# Patient Record
Sex: Female | Born: 1995
Health system: Southern US, Community
[De-identification: ages and names within clinical notes are randomized; demographics above are authoritative.]

## PROBLEM LIST (undated history)

## (undated) DIAGNOSIS — N200 Calculus of kidney: Secondary | ICD-10-CM

## (undated) DIAGNOSIS — N926 Irregular menstruation, unspecified: Secondary | ICD-10-CM

## (undated) DIAGNOSIS — Z8669 Personal history of other diseases of the nervous system and sense organs: Secondary | ICD-10-CM

## (undated) DIAGNOSIS — J45909 Unspecified asthma, uncomplicated: Secondary | ICD-10-CM

## (undated) DIAGNOSIS — Z8742 Personal history of other diseases of the female genital tract: Secondary | ICD-10-CM

## (undated) DIAGNOSIS — D649 Anemia, unspecified: Secondary | ICD-10-CM

## (undated) HISTORY — DX: Unspecified asthma, uncomplicated: J45.909

## (undated) HISTORY — DX: Personal history of other diseases of the nervous system and sense organs: Z86.69

## (undated) HISTORY — DX: Anemia, unspecified: D64.9

## (undated) HISTORY — DX: Personal history of other diseases of the female genital tract: Z87.42

## (undated) HISTORY — DX: Calculus of kidney: N20.0

## (undated) HISTORY — DX: Irregular menstruation, unspecified: N92.6

## (undated) HISTORY — PX: WISDOM TOOTH EXTRACTION: SHX21

---
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
## 2016-08-10 ENCOUNTER — Other Ambulatory Visit: Payer: Self-pay | Admitting: Physician Assistant

## 2016-12-13 ENCOUNTER — Other Ambulatory Visit: Payer: Self-pay | Admitting: Obstetrics & Gynecology

## 2019-06-25 ENCOUNTER — Encounter: Payer: Self-pay | Admitting: Obstetrics and Gynecology

## 2019-06-25 ENCOUNTER — Telehealth: Payer: Self-pay | Admitting: Obstetrics and Gynecology

## 2019-06-25 ENCOUNTER — Ambulatory Visit (INDEPENDENT_AMBULATORY_CARE_PROVIDER_SITE_OTHER): Payer: 59 | Admitting: Obstetrics and Gynecology

## 2019-06-25 ENCOUNTER — Other Ambulatory Visit: Payer: Self-pay

## 2019-06-25 VITALS — BP 105/73 | HR 106 | Ht 61.0 in | Wt 142.0 lb

## 2019-06-25 DIAGNOSIS — Z3009 Encounter for other general counseling and advice on contraception: Secondary | ICD-10-CM | POA: Diagnosis not present

## 2019-06-25 NOTE — Progress Notes (Signed)
HPI:      Christine Mcbride is a 24 y.o. G0P0 who LMP was Patient's last menstrual period was 06/25/2019.  Subjective:   She presents today to discuss her birth control options.  She currently has Nexplanon and says that has begun to hurt her arm and it has caused irregular bleeding for the last 2 years.  She states that she wants it out and is considering other forms of birth control. Of significant note patient has tried IUD (for 1 week) and had it removed, she has used OCPs in the past for approximately 5 years.    Hx: The following portions of the patient's history were reviewed and updated as appropriate:             She  has a past medical history of Anemia, Asthma, H/O dysmenorrhea, migraines, and Irregular menstrual cycle. She does not have a problem list on file. She  has no past surgical history on file. Her family history includes Diabetes in her father, maternal grandmother, mother, and paternal grandmother; Heart attack in her paternal grandmother; Hypertension in her maternal grandfather; Ovarian cancer in her mother. She  reports that she has never smoked. She has never used smokeless tobacco. She reports that she does not drink alcohol or use drugs. She currently has no medications in their medication list. She has No Known Allergies.       Review of Systems:  Review of Systems  Constitutional: Denied constitutional symptoms, night sweats, recent illness, fatigue, fever, insomnia and weight loss.  Eyes: Denied eye symptoms, eye pain, photophobia, vision change and visual disturbance.  Ears/Nose/Throat/Neck: Denied ear, nose, throat or neck symptoms, hearing loss, nasal discharge, sinus congestion and sore throat.  Cardiovascular: Denied cardiovascular symptoms, arrhythmia, chest pain/pressure, edema, exercise intolerance, orthopnea and palpitations.  Respiratory: Denied pulmonary symptoms, asthma, pleuritic pain, productive sputum, cough, dyspnea and wheezing.   Gastrointestinal: Denied, gastro-esophageal reflux, melena, nausea and vomiting.  Genitourinary: See HPI for additional information.  Musculoskeletal: Denied musculoskeletal symptoms, stiffness, swelling, muscle weakness and myalgia.  Dermatologic: Denied dermatology symptoms, rash and scar.  Neurologic: Denied neurology symptoms, dizziness, headache, neck pain and syncope.  Psychiatric: Denied psychiatric symptoms, anxiety and depression.  Endocrine: Denied endocrine symptoms including hot flashes and night sweats.   Meds:   No current outpatient medications on file prior to visit.   No current facility-administered medications on file prior to visit.    Objective:     Vitals:   06/25/19 1408  BP: 105/73  Pulse: (!) 106                Assessment:    G0P0 There are no problems to display for this patient.    1. Birth control counseling        Plan:            1.  Birth Control I discussed multiple birth control options and methods with the patient.  The risks and benefits of each were reviewed. We specifically discussed IUD Mirena and Kyleena, NuvaRing, multiple forms of OCPs including pills designed to lighten your menstrual period, pills designed to skip menstrual periods etc.  All questions were answered. 2.  Patient would like to schedule a visit for Nexplanon removal and OCP start.  At her 17-month follow-up after beginning OCPs she would like her annual exam performed with her first Pap smear.  Orders No orders of the defined types were placed in this encounter.   No orders of the defined  types were placed in this encounter.     F/U  Return in about 1 week (around 07/02/2019). I spent 18 minutes involved in the care of this patient preparing to see the patient by obtaining and reviewing her medical history (including labs, imaging tests and prior procedures), documenting clinical information in the electronic health record (EHR), counseling and coordinating  care plans, writing and sending prescriptions, ordering tests or procedures and directly communicating with the patient by discussing pertinent items from her history and physical exam as well as detailing my assessment and plan as noted above so that she has an informed understanding.  All of her questions were answered.  Elonda Husky, M.D. 06/25/2019 2:36 PM

## 2019-06-25 NOTE — Telephone Encounter (Signed)
Called pt to schud a follow up from a visit the number given is for [employer] and the pt doesn't work for them

## 2019-07-02 ENCOUNTER — Ambulatory Visit (INDEPENDENT_AMBULATORY_CARE_PROVIDER_SITE_OTHER): Payer: 59 | Admitting: Obstetrics and Gynecology

## 2019-07-02 ENCOUNTER — Encounter: Payer: Self-pay | Admitting: Obstetrics and Gynecology

## 2019-07-02 ENCOUNTER — Other Ambulatory Visit: Payer: Self-pay

## 2019-07-02 VITALS — BP 111/70 | HR 87 | Wt 144.0 lb

## 2019-07-02 DIAGNOSIS — Z3046 Encounter for surveillance of implantable subdermal contraceptive: Secondary | ICD-10-CM

## 2019-07-02 DIAGNOSIS — Z30011 Encounter for initial prescription of contraceptive pills: Secondary | ICD-10-CM

## 2019-07-02 MED ORDER — LEVONORGEST-ETH ESTRAD 91-DAY 0.15-0.03 &0.01 MG PO TABS: 1.0000 | ORAL_TABLET | Freq: Every day | ORAL | 1 refills | Status: DC

## 2019-07-02 NOTE — Progress Notes (Signed)
HPI:      [name] is a 24 y.o. G0P0 who LMP was Patient's last menstrual period was 06/25/2019.  Subjective:   She presents today for Nexplanon removal.  She does not like some of the side effects from the Nexplanon and in addition it feels uncomfortable in her arm.  She would like to begin OCPs.  She has chosen [employer] because she would like to have her period every 3 months.    Hx: The following portions of the patient's history were reviewed and updated as appropriate:             She  has a past medical history of Anemia, Asthma, H/O dysmenorrhea, migraines, and Irregular menstrual cycle. She does not have a problem list on file. She  has no past surgical history on file. Her family history includes Diabetes in her father, maternal grandmother, mother, and paternal grandmother; Heart attack in her paternal grandmother; Hypertension in her maternal grandfather; Ovarian cancer in her mother. She  reports that she has never smoked. She has never used smokeless tobacco. She reports that she does not drink alcohol or use drugs. She currently has no medications in their medication list. She has No Known Allergies.       Review of Systems:  Review of Systems  Constitutional: Denied constitutional symptoms, night sweats, recent illness, fatigue, fever, insomnia and weight loss.  Eyes: Denied eye symptoms, eye pain, photophobia, vision change and visual disturbance.  Ears/Nose/Throat/Neck: Denied ear, nose, throat or neck symptoms, hearing loss, nasal discharge, sinus congestion and sore throat.  Cardiovascular: Denied cardiovascular symptoms, arrhythmia, chest pain/pressure, edema, exercise intolerance, orthopnea and palpitations.  Respiratory: Denied pulmonary symptoms, asthma, pleuritic pain, productive sputum, cough, dyspnea and wheezing.  Gastrointestinal: Denied, gastro-esophageal reflux, melena, nausea and vomiting.  Genitourinary: Denied genitourinary symptoms including  symptomatic vaginal discharge, pelvic relaxation issues, and urinary complaints.  Musculoskeletal: Denied musculoskeletal symptoms, stiffness, swelling, muscle weakness and myalgia.  Dermatologic: Denied dermatology symptoms, rash and scar.  Neurologic: Denied neurology symptoms, dizziness, headache, neck pain and syncope.  Psychiatric: Denied psychiatric symptoms, anxiety and depression.  Endocrine: Denied endocrine symptoms including hot flashes and night sweats.   Meds:   No current outpatient medications on file prior to visit.   No current facility-administered medications on file prior to visit.    Objective:     Vitals:   07/02/19 0933  BP: 111/70  Pulse: 87              Implanon removal Procedure note - The implant was noted in the patient's arm and the end was identified. The skin was cleansed with a Betadine solution. A small injection of subcutaneous lidocaine with epinephrine was given over the end of the rod. An incision was made at the end of the implant. The rod was noted in the incision and grasp with a hemostat. The capsule was nicked and the rod was again grasped and removed. It was noted to be intact.  The patient's skin was again cleansed.  Steri-Strips were placed approximating the incision. Hemostasis was noted.   Assessment:    G0P0 There are no problems to display for this patient.    1. Nexplanon removal   2. Initiation of OCP (BCP)        Plan:            1.  OCPs The risks /benefits of OCPs have been explained to the patient in detail.  Product literature has been given to  her where appropriate.  I have instructed her in the use of OCPs.  I have explained to the patient that OCPs are not as effective for birth control during the first month of use, and that another form of contraception should be used during this time.  Both first-day start and Sunday start have been explained.  The risks and benefits of each was discussed.  She has been made  aware of  the fact that in rare circumstances, other medications may affect the efficacy of OCPs.  I have answered all of her questions, and I believe that she has an understanding of the effectiveness and use of OCPs.  Orders No orders of the defined types were placed in this encounter.   No orders of the defined types were placed in this encounter.     F/U  Return in about 3 months (around 09/29/2019) for Annual Physical. I spent 22 minutes involved in the care of this patient preparing to see the patient by obtaining and reviewing her medical history (including labs, imaging tests and prior procedures), documenting clinical information in the electronic health record (EHR), counseling and coordinating care plans, writing and sending prescriptions, ordering tests or procedures and directly communicating with the patient by discussing pertinent items from her history and physical exam as well as detailing my assessment and plan as noted above so that she has an informed understanding.  All of her questions were answered.  Finis Bud, M.D. 07/02/2019 9:58 AM

## 2019-09-26 ENCOUNTER — Other Ambulatory Visit (HOSPITAL_COMMUNITY)
Admission: RE | Admit: 2019-09-26 | Discharge: 2019-09-26 | Disposition: A | Discharge status: Home / Self Care | Payer: 59 | Source: Ambulatory Visit | Attending: Obstetrics and Gynecology | Admitting: Obstetrics and Gynecology

## 2019-09-26 ENCOUNTER — Ambulatory Visit (INDEPENDENT_AMBULATORY_CARE_PROVIDER_SITE_OTHER): Payer: 59 | Admitting: Obstetrics and Gynecology

## 2019-09-26 ENCOUNTER — Other Ambulatory Visit: Payer: Self-pay

## 2019-09-26 ENCOUNTER — Encounter: Payer: Self-pay | Admitting: Obstetrics and Gynecology

## 2019-09-26 VITALS — BP 115/76 | HR 97 | Temp 98.5°F | Ht 62.0 in | Wt 146.2 lb

## 2019-09-26 DIAGNOSIS — Z30011 Encounter for initial prescription of contraceptive pills: Secondary | ICD-10-CM | POA: Diagnosis not present

## 2019-09-26 DIAGNOSIS — Z01419 Encounter for gynecological examination (general) (routine) without abnormal findings: Secondary | ICD-10-CM | POA: Diagnosis present

## 2019-09-26 DIAGNOSIS — Z3046 Encounter for surveillance of implantable subdermal contraceptive: Secondary | ICD-10-CM | POA: Diagnosis not present

## 2019-09-26 MED ORDER — LEVONORGEST-ETH ESTRAD 91-DAY 0.15-0.03 &0.01 MG PO TABS
1.0000 | ORAL_TABLET | Freq: Every day | ORAL | 3 refills | Status: DC
Start: 2019-09-26 — End: 2020-09-29

## 2019-09-26 NOTE — Progress Notes (Signed)
HPI:      [name] is a 24 y.o. G0P0 who LMP was Patient's last menstrual period was 06/06/2019 (within weeks).  Subjective:   She presents today for her annual examination.  She is taking OCPs as directed.  Her bleeding has been controlled for the last 3 months and she is very happy with this method.  She is about to start her next pack.    Hx: The following portions of the patient's history were reviewed and updated as appropriate:             She  has a past medical history of Anemia, Asthma, H/O dysmenorrhea, migraines, and Irregular menstrual cycle. She does not have a problem list on file. She  has no past surgical history on file. Her family history includes Diabetes in her father, maternal grandmother, mother, and paternal grandmother; Heart attack in her paternal grandmother; Hypertension in her maternal grandfather; Ovarian cancer in her mother. She  reports that she has never smoked. She has never used smokeless tobacco. She reports that she does not drink alcohol or use drugs. She has a current medication list which includes the following prescription(s): levonorgestrel-ethinyl estradiol. She has No Known Allergies.       Review of Systems:  Review of Systems  Constitutional: Denied constitutional symptoms, night sweats, recent illness, fatigue, fever, insomnia and weight loss.  Eyes: Denied eye symptoms, eye pain, photophobia, vision change and visual disturbance.  Ears/Nose/Throat/Neck: Denied ear, nose, throat or neck symptoms, hearing loss, nasal discharge, sinus congestion and sore throat.  Cardiovascular: Denied cardiovascular symptoms, arrhythmia, chest pain/pressure, edema, exercise intolerance, orthopnea and palpitations.  Respiratory: Denied pulmonary symptoms, asthma, pleuritic pain, productive sputum, cough, dyspnea and wheezing.  Gastrointestinal: Denied, gastro-esophageal reflux, melena, nausea and vomiting.  Genitourinary: Denied genitourinary  symptoms including symptomatic vaginal discharge, pelvic relaxation issues, and urinary complaints.  Musculoskeletal: Denied musculoskeletal symptoms, stiffness, swelling, muscle weakness and myalgia.  Dermatologic: Denied dermatology symptoms, rash and scar.  Neurologic: Denied neurology symptoms, dizziness, headache, neck pain and syncope.  Psychiatric: Denied psychiatric symptoms, anxiety and depression.  Endocrine: Denied endocrine symptoms including hot flashes and night sweats.   Meds:   No current outpatient medications on file prior to visit.   No current facility-administered medications on file prior to visit.    Objective:     Vitals:   09/26/19 0806  BP: 115/76  Pulse: 97  Temp: 98.5 F (36.9 C)              Physical examination General NAD, Conversant  HEENT Atraumatic; Op clear with mmm.  Normo-cephalic. Pupils reactive. Anicteric sclerae  Thyroid/Neck Smooth without nodularity or enlargement. Normal ROM.  Neck Supple.  Skin No rashes, lesions or ulceration. Normal palpated skin turgor. No nodularity.  Small skin nodular structure in the left inguinal region.  Breasts: No masses or discharge.  Symmetric.  No axillary adenopathy.  Lungs: Clear to auscultation.No rales or wheezes. Normal Respiratory effort, no retractions.  Heart: NSR.  No murmurs or rubs appreciated. No periferal edema  Abdomen: Soft.  Non-tender.  No masses.  No HSM. No hernia  Extremities: Moves all appropriately.  Normal ROM for age. No lymphadenopathy.  Neuro: Oriented to PPT.  Normal mood. Normal affect.     Pelvic:   Vulva: Normal appearance.  No lesions.  Vagina: No lesions or abnormalities noted.  Support: Normal pelvic support.  Urethra No masses tenderness or scarring.  Meatus Normal size without lesions or prolapse.  Cervix:  Normal appearance.  No lesions.  Anus: Normal exam.  No lesions.  Perineum: Normal exam.  No lesions.        Bimanual   Uterus: Normal size.  Non-tender.   Mobile.  AV.  Adnexae: No masses.  Non-tender to palpation.  Cul-de-sac: Negative for abnormality.      Assessment:    G0P0 There are no problems to display for this patient.    1. Well woman exam with routine gynecological exam   2. Nexplanon removal   3. Initiation of OCP (BCP)     Patient doing well on OCPs.  Would like to continue for cycle control.  Small skin nodular consistent with inclusion cyst-benign.   Plan:            1.  Basic Screening Recommendations The basic screening recommendations for asymptomatic women were discussed with the patient during her visit.  The age-appropriate recommendations were discussed with her and the rational for the tests reviewed.  When I am informed by the patient that another primary care physician has previously obtained the age-appropriate tests and they are up-to-date, only outstanding tests are ordered and referrals given as necessary.  Abnormal results of tests will be discussed with her when all of her results are completed.  Routine preventative health maintenance measures emphasized: Exercise/Diet/Weight control, Tobacco Warnings, Alcohol/Substance use risks and Stress Management Pap performed-GC/CT. 2.  Continue OCPs Orders No orders of the defined types were placed in this encounter.    Meds ordered this encounter  Medications  . Levonorgestrel-Ethinyl Estradiol (AMETHIA) 0.15-0.03 &0.01 MG tablet    Sig: Take 1 tablet by mouth at bedtime.    Dispense:  84 tablet    Refill:  3        F/U  Return in about 1 year (around 09/25/2020) for Annual Physical.  Finis Bud, M.D. 09/26/2019 8:36 AM

## 2019-09-27 LAB — CYTOLOGY - PAP
Chlamydia: NEGATIVE
Comment: NEGATIVE
Comment: NORMAL
Diagnosis: NEGATIVE
Neisseria Gonorrhea: NEGATIVE

## 2019-10-16 ENCOUNTER — Telehealth: Payer: Self-pay | Admitting: Obstetrics and Gynecology

## 2019-10-16 NOTE — Telephone Encounter (Signed)
Patient called in saying she wanted to come in for a rash that she was having. Could you please advise?

## 2019-10-16 NOTE — Telephone Encounter (Signed)
Please advise on seeing patient.

## 2019-10-16 NOTE — Telephone Encounter (Signed)
Patient said she found the cause of her rash- patient states it was chiggers and she put alcohol on her rash and that cleared it up. Patient ended up declining appointment she had previously requested.

## 2019-10-16 NOTE — Telephone Encounter (Signed)
Can you please schedule patient.

## 2020-09-29 ENCOUNTER — Ambulatory Visit (INDEPENDENT_AMBULATORY_CARE_PROVIDER_SITE_OTHER): Payer: BC Managed Care – PPO | Admitting: Obstetrics and Gynecology

## 2020-09-29 ENCOUNTER — Other Ambulatory Visit: Payer: Self-pay

## 2020-09-29 ENCOUNTER — Encounter: Payer: Self-pay | Admitting: Obstetrics and Gynecology

## 2020-09-29 VITALS — BP 112/77 | HR 92 | Ht 61.0 in | Wt 140.6 lb

## 2020-09-29 DIAGNOSIS — Z01419 Encounter for gynecological examination (general) (routine) without abnormal findings: Secondary | ICD-10-CM | POA: Diagnosis not present

## 2020-09-29 MED ORDER — LEVONORGEST-ETH ESTRAD 91-DAY 0.15-0.03 &0.01 MG PO TABS: 1.0000 | ORAL_TABLET | Freq: Every day | ORAL | 3 refills | Status: DC

## 2020-09-29 NOTE — Progress Notes (Signed)
HPI:      [name] is a 25 y.o. G0P0 who LMP was Patient's last menstrual period was 09/10/2020 (approximate).  Subjective:   She presents today for her annual examination.  She states that her OCPs have recently begun to make her nauseated.  She has switched up how she takes them and reports that her eating habits are not very good right now.  She has overall decided to continue the same OCPs.  She likes the every 52-month cycle regulation, and her dysmenorrhea is significantly improved.     Hx: The following portions of the patient's history were reviewed and updated as appropriate:             She  has a past medical history of Anemia, Asthma, H/O dysmenorrhea, migraines, and Irregular menstrual cycle. She does not have a problem list on file. She  has no past surgical history on file. Her family history includes Diabetes in her father, maternal grandmother, mother, and paternal grandmother; Heart attack in her paternal grandmother; Hypertension in her maternal grandfather; Ovarian cancer in her mother. She  reports that she has never smoked. She has never used smokeless tobacco. She reports that she does not drink alcohol and does not use drugs. She has a current medication list which includes the following prescription(s): levonorgestrel-ethinyl estradiol. She has No Known Allergies.       Review of Systems:  Review of Systems  Constitutional: Denied constitutional symptoms, night sweats, recent illness, fatigue, fever, insomnia and weight loss.  Eyes: Denied eye symptoms, eye pain, photophobia, vision change and visual disturbance.  Ears/Nose/Throat/Neck: Denied ear, nose, throat or neck symptoms, hearing loss, nasal discharge, sinus congestion and sore throat.  Cardiovascular: Denied cardiovascular symptoms, arrhythmia, chest pain/pressure, edema, exercise intolerance, orthopnea and palpitations.  Respiratory: Denied pulmonary symptoms, asthma, pleuritic pain, productive  sputum, cough, dyspnea and wheezing.  Gastrointestinal: Denied, gastro-esophageal reflux, melena, nausea and vomiting.  Genitourinary: Denied genitourinary symptoms including symptomatic vaginal discharge, pelvic relaxation issues, and urinary complaints.  Musculoskeletal: Denied musculoskeletal symptoms, stiffness, swelling, muscle weakness and myalgia.  Dermatologic: Denied dermatology symptoms, rash and scar.  Neurologic: Denied neurology symptoms, dizziness, headache, neck pain and syncope.  Psychiatric: Denied psychiatric symptoms, anxiety and depression.  Endocrine: Denied endocrine symptoms including hot flashes and night sweats.   Meds:   No current outpatient medications on file prior to visit.   No current facility-administered medications on file prior to visit.       [facility] - 09/29/20 0813      Pregnancy Intention Screening   Does the patient want to become pregnant in the next year? No    Does the patient's partner want to become pregnant in the next year? No    Would the patient like to discuss contraceptive options today? Yes      Contraception Wrap Up   Current Method Oral Contraceptive    End Method Oral Contraceptive    Contraception Counseling Provided Yes          The pregnancy intention screening data noted above was reviewed. Potential methods of contraception were discussed. The patient elected to proceed with Oral Contraceptive.     Objective:     Vitals:   09/29/20 0809  BP: 112/77  Pulse: 92    Filed Weights   09/29/20 0809  Weight: 140 lb 9.6 oz (63.8 kg)              Physical examination General NAD, Conversant  HEENT Atraumatic; Op  clear with mmm.  Normo-cephalic. Pupils reactive. Anicteric sclerae  Thyroid/Neck Smooth without nodularity or enlargement. Normal ROM.  Neck Supple.  Skin No rashes, lesions or ulceration. Normal palpated skin turgor. No nodularity.  Breasts: No masses or discharge.  Symmetric.  No axillary adenopathy.   Lungs: Clear to auscultation.No rales or wheezes. Normal Respiratory effort, no retractions.  Heart: NSR.  No murmurs or rubs appreciated. No periferal edema  Abdomen: Soft.  Non-tender.  No masses.  No HSM. No hernia  Extremities: Moves all appropriately.  Normal ROM for age. No lymphadenopathy.  Neuro: Oriented to PPT.  Normal mood. Normal affect.     Pelvic:   Vulva: Normal appearance.  No lesions.  Vagina: No lesions or abnormalities noted.  Support: Normal pelvic support.  Urethra No masses tenderness or scarring.  Meatus Normal size without lesions or prolapse.  Cervix: Normal appearance.  No lesions.  Anus: Normal exam.  No lesions.  Perineum: Normal exam.  No lesions.        Bimanual   Uterus: Normal size.  Non-tender.  Mobile.  AV.  Adnexae: No masses.  Non-tender to palpation.  Cul-de-sac: Negative for abnormality.      Assessment:    G0P0 There are no problems to display for this patient.    1. Well woman exam with routine gynecological exam        Plan:            1.  Basic Screening Recommendations The basic screening recommendations for asymptomatic women were discussed with the patient during her visit.  The age-appropriate recommendations were discussed with her and the rational for the tests reviewed.  When I am informed by the patient that another primary care physician has previously obtained the age-appropriate tests and they are up-to-date, only outstanding tests are ordered and referrals given as necessary.  Abnormal results of tests will be discussed with her when all of her results are completed.  Routine preventative health maintenance measures emphasized: Exercise/Diet/Weight control, Tobacco Warnings, Alcohol/Substance use risks and Stress Management 2.  Patient would like to continue OCPs. Orders No orders of the defined types were placed in this encounter.    Meds ordered this encounter  Medications  . Levonorgestrel-Ethinyl Estradiol  (AMETHIA) 0.15-0.03 &0.01 MG tablet    Sig: Take 1 tablet by mouth at bedtime.    Dispense:  84 tablet    Refill:  3            F/U  Return in about 1 year (around 09/29/2021) for Annual Physical.   Elonda Husky, M.D. 09/29/2020 8:52 AM

## 2020-10-20 ENCOUNTER — Other Ambulatory Visit: Payer: Self-pay

## 2020-10-20 ENCOUNTER — Encounter: Payer: Self-pay | Admitting: Obstetrics and Gynecology

## 2020-10-20 ENCOUNTER — Ambulatory Visit (INDEPENDENT_AMBULATORY_CARE_PROVIDER_SITE_OTHER): Payer: BC Managed Care – PPO | Admitting: Obstetrics and Gynecology

## 2020-10-20 VITALS — BP 114/77 | Temp 96.0°F | Ht 62.0 in | Wt 143.0 lb

## 2020-10-20 DIAGNOSIS — Z3201 Encounter for pregnancy test, result positive: Secondary | ICD-10-CM

## 2020-10-20 DIAGNOSIS — Z3200 Encounter for pregnancy test, result unknown: Secondary | ICD-10-CM

## 2020-10-20 DIAGNOSIS — R102 Pelvic and perineal pain: Secondary | ICD-10-CM | POA: Diagnosis not present

## 2020-10-20 NOTE — Patient Instructions (Signed)
Endometriosis  Endometriosis is a condition in which a tissue similar to the endometrium grows in places outside the uterus. The endometrium is a tissue that forms the lining of the uterus. This tissue can grow in the organs that create the eggs (ovaries), the tubes that carry the eggs to the uterus (fallopian tubes), the vagina, and the bowel. This tissue most often grows on the ovaries and inner lining of the pelvic cavity (peritoneum). When the uterus sheds the endometrium every menstrual cycle, there is bleeding wherever these types of tissue are located. This can cause pain because blood is irritating to tissues that are not normally exposed to it. Endometriosis canalso make it harder for a woman to get pregnant. What are the causes? The cause of this condition is not known. What increases the risk? The following factors may make you more likely to develop this condition: Having a family history of endometriosis. Having never given birth. Starting your period at 10 years of age or younger. What are the signs or symptoms? Often, there are no symptoms of this condition. If you do have symptoms, they may: Vary depending on where the abnormal tissue is growing. Occur during your menstrual period (most often) or at the middle of your cycle. Come and go. You may have no symptoms during some months. Stop when you no longer have your monthly periods (menopause). Symptoms may include: Pain in the area between your hip bones (pelvis). Heavier bleeding during periods. Menstrual periods that happen more than once a month. Pain during sex. Pain in the back or abdomen. Painful bowel movements. Not being able to get pregnant. How is this diagnosed? This condition is diagnosed based on your symptoms and a physical exam. You may have tests, such as: Blood tests and urine tests to help rule out other causes. Ultrasound to look for tissues that are not normal. This is often done over your skin. It is  sometimes done through the vagina (transvaginal). X-ray of the lower bowel (barium enema). CT scan. MRI. To confirm the diagnosis, your health care provider may use a device with a small camera to check tissue inside your abdomen (laparoscopy). Abnormal tissue may be removed and checked in a lab (biopsy). How is this treated? There is no cure for this condition. Treatment focuses on controlling your symptoms. The type of treatment also depends on whether you want to become pregnant in the future. This condition may be treated with: Medicines. These may include: Medicines to relieve pain, including NSAIDs such as ibuprofen. Hormone therapy. This uses artificial hormones to slow the growth of the abnormal tissue. This may include hormonal birth control, such as pills. Surgery to remove the abnormal tissue. During surgery: Tissue may be removed using a laparoscope and a laser (laparoscopic laser treatment). The fallopian tubes, uterus, and ovaries may be removed (hysterectomy). This is done in very severe cases. Follow these instructions at home: Get regular exercise. Limit alcohol use. Eat a balanced diet. Avoid caffeine. Take over-the-counter and prescription medicines only as told by your health care provider. Keep all follow-up visits as told by your health care provider. This is important. Where to find more information [employer] Ob[employer]ians [employer]nec[employer]: https://www.acog.org/ Office on Women's Health: https://www.womenshealth.gov/ Contact a health care provider if: You are having new pain or trouble controlling pain. You have problems getting pregnant. You have a fever. Get help right away if you have: Severe pain that does not get better with medicine. Severe nausea and vomiting, or   if you cannot eat or drink without vomiting. Pain that affects your abdomen only on the lower, right side. Pain in your abdomen that gets worse. Swelling in your abdomen. Blood in  your stool (feces). Summary Endometriosis is a condition in which a tissue similar to the endometrium grows in places outside the uterus. The endometrium is a tissue that forms the lining of the uterus. The cause of this condition is not known. This condition may be treated with medicines to relieve pain, hormone therapy, or surgery. If you have this condition, get regular exercise, limit alcohol use, and avoid caffeine. Get help right away if you have severe pain that does not get better with medicine, or if you have severe nausea and vomiting or blood in your stool. This information is not intended to replace advice given to you by your health care provider. Make sure you discuss any questions you have with your healthcare provider. Document Revised: 06/26/2019 Document Reviewed: 06/26/2019 Elsevier Patient Education  2021 [employer]  

## 2020-10-20 NOTE — Progress Notes (Signed)
HPI:      [name] is a 25 y.o. G0P0 who LMP was No LMP recorded.  Subjective:   She presents today stating that 2 weeks ago she had significant pelvic pain on her left side.  At the time it occurred it was disabling but she slowly got better.  She continues to experience intermittent left-sided pain.  Pain is worse with intercourse and with bowel movements.  She does report that she has had this pain her entire life but last week was the worst it has ever been. She she is is taking OCPs as directed.  She is requesting a pregnancy test today although she states that she has done some home tests and they have all been negative. She does state that she had a remote history of a pelvic STD.     Hx: The following portions of the patient's history were reviewed and updated as appropriate:             She  has a past medical history of Anemia, Asthma, H/O dysmenorrhea, migraines, and Irregular menstrual cycle. She does not have a problem list on file. She  has no past surgical history on file. Her family history includes Diabetes in her father, maternal grandmother, mother, and paternal grandmother; Heart attack in her paternal grandmother; Hypertension in her maternal grandfather; Ovarian cancer in her mother. She  reports that she has never smoked. She has never used smokeless tobacco. She reports that she does not drink alcohol and does not use drugs. She has a current medication list which includes the following prescription(s): levonorgestrel-ethinyl estradiol. She has No Known Allergies.       Review of Systems:  Review of Systems  Constitutional: Denied constitutional symptoms, night sweats, recent illness, fatigue, fever, insomnia and weight loss.  Eyes: Denied eye symptoms, eye pain, photophobia, vision change and visual disturbance.  Ears/Nose/Throat/Neck: Denied ear, nose, throat or neck symptoms, hearing loss, nasal discharge, sinus congestion and sore throat.   Cardiovascular: Denied cardiovascular symptoms, arrhythmia, chest pain/pressure, edema, exercise intolerance, orthopnea and palpitations.  Respiratory: Denied pulmonary symptoms, asthma, pleuritic pain, productive sputum, cough, dyspnea and wheezing.  Gastrointestinal: Denied, gastro-esophageal reflux, melena, nausea and vomiting.  Genitourinary: See HPI for additional information.  Musculoskeletal: Denied musculoskeletal symptoms, stiffness, swelling, muscle weakness and myalgia.  Dermatologic: Denied dermatology symptoms, rash and scar.  Neurologic: Denied neurology symptoms, dizziness, headache, neck pain and syncope.  Psychiatric: Denied psychiatric symptoms, anxiety and depression.  Endocrine: Denied endocrine symptoms including hot flashes and night sweats.   Meds:   Current Outpatient Medications on File Prior to Visit  Medication Sig Dispense Refill  . Levonorgestrel-Ethinyl Estradiol (AMETHIA) 0.15-0.03 &0.01 MG tablet Take 1 tablet by mouth at bedtime. 84 tablet 3   No current facility-administered medications on file prior to visit.          Objective:     Vitals:   10/20/20 1048  BP: 114/77  Temp: (!) 96 F (35.6 C)   Filed Weights   10/20/20 1048  Weight: 143 lb (64.9 kg)              Physical examination   Pelvic:   Vulva: Normal appearance.  No lesions.  Vagina: No lesions or abnormalities noted.  Support: Normal pelvic support.  Urethra No masses tenderness or scarring.  Meatus Normal size without lesions or prolapse.  Cervix: Normal appearance.  No lesions.  Anus: Normal exam.  No lesions.  Perineum: Normal exam.  No lesions.  Bimanual   Uterus: Normal size.  Non-tender.  Mobile.  AV.  Adnexae: No masses.  Non-tender to palpation.  Cul-de-sac: Negative for abnormality.   Urinary pregnancy test positive  Assessment:    G0P0 There are no problems to display for this patient.    1. Pelvic pain in female   2. Possible pregnancy, not  confirmed     Positive pregnancy test.   Plan:          Prenatal Plan 1.  The patient was given prenatal literature. 2.  She was continued on prenatal vitamins. 3.  A prenatal lab panel to be drawn at nurse visit. 4.  An ultrasound was ordered to better determine an EDC. 5.  A nurse visit was scheduled. 6.  Genetic testing and testing for other inheritable conditions discussed in detail. She will decide in the future whether to have these labs performed. 7.  A general overview of pregnancy testing, visit schedule, ultrasound schedule, and prenatal care was discussed. 8.  COVID and its risks associated with pregnancy, prevention by limiting exposure and use of masks, as well as the risks and benefits of vaccination during pregnancy were discussed in detail.  [facility] policy regarding office and hospital visitation and testing was explained. 9.  Benefits of breast-feeding discussed in detail including both maternal and infant benefits. Ready Set Baby website discussed.    Orders Orders Placed This Encounter  Procedures  . US OB Comp Less 14 Wks    No orders of the defined types were placed in this encounter.     F/U  Return in about 5 weeks (around 11/24/2020) for We will contact her with any abnormal test results. I spent 35 minutes involved in the care of this patient preparing to see the patient by obtaining and reviewing her medical history (including labs, imaging tests and prior procedures), documenting clinical information in the electronic health record (EHR), counseling and coordinating care plans, writing and sending prescriptions, ordering tests or procedures and directly communicating with the patient by discussing pertinent items from her history and physical exam as well as detailing my assessment and plan as noted above so that she has an informed understanding.  All of her questions were answered.  Elonda Husky, M.D. 10/20/2020 12:10 PM

## 2020-11-02 ENCOUNTER — Other Ambulatory Visit: Payer: Self-pay

## 2020-11-02 ENCOUNTER — Ambulatory Visit (INDEPENDENT_AMBULATORY_CARE_PROVIDER_SITE_OTHER): Payer: BC Managed Care – PPO

## 2020-11-02 ENCOUNTER — Other Ambulatory Visit: Payer: Self-pay | Admitting: Obstetrics and Gynecology

## 2020-11-02 VITALS — BP 110/75 | HR 92 | Ht 62.0 in | Wt 145.1 lb

## 2020-11-02 DIAGNOSIS — Z113 Encounter for screening for infections with a predominantly sexual mode of transmission: Secondary | ICD-10-CM | POA: Diagnosis not present

## 2020-11-02 DIAGNOSIS — Z0283 Encounter for blood-alcohol and blood-drug test: Secondary | ICD-10-CM | POA: Diagnosis not present

## 2020-11-02 DIAGNOSIS — Z3401 Encounter for supervision of normal first pregnancy, first trimester: Secondary | ICD-10-CM

## 2020-11-02 NOTE — Patient Instructions (Signed)
WHAT OB PATIENTS CAN EXPECT  Confirmation of pregnancy and ultrasound ordered if medically indicated-[redacted] weeks gestation New OB (NOB) intake with nurse and New OB (NOB) labs- [redacted] weeks gestation New OB (NOB) physical examination with provider- 11/[redacted] weeks gestation Flu vaccine-[redacted] weeks gestation Anatomy scan-[redacted] weeks gestation Glucose tolerance test, blood work to test for anemia, T-dap vaccine-[redacted] weeks gestation Vaginal swabs/cultures-STD/Group B strep-[redacted] weeks gestation Appointments every 4 weeks until 28 weeks Every 2 weeks from 28 weeks until 36 weeks Weekly visits from 36 weeks until delivery  Tests and Screening During Pregnancy Having certain tests and screenings during pregnancy is an important part of your prenatal care. These tests help your health care provider find problems that might affect your pregnancy. Some tests must be done for all pregnant women, and some are optional. Most of the tests and screenings do not pose any risks for you or your baby. You may need additional testing if any routinetests indicate a problem. Tests and screenings done early in pregnancy Some tests and screenings you can expect to have in early pregnancy include: Blood tests, such as: Complete blood count (CBC). This test is done to check your red and white blood cells. It can help identify a risk for anemia, infection, or bleeding. Blood typing. This test shows your blood type. It also shows whether you have a certain protein in your red blood cells called the Rh factor. It can be dangerous for your baby if you do not have this protein (Rh negative) and your baby has it (Rh positive). Tests to check for diseases that can cause birth defects or can be passed to your baby, such as: [place] measles (rubella) and chicken pox. The test indicates whether you are immune to these diseases. Hepatitis B and C. Human Immunodeficiency Virus (HIV). Syphilis. Zika virus, if you or your partner has traveled to an area  where the virus occurs. Urine testing. This checks for sugar in your urine and for signs of infection. Blood pressure. This is to check for high blood pressure and preeclampsia. Testing for sexually transmitted infections (STIs), such as chlamydia or gonorrhea. Testing for tuberculosis. You may have this skin test if you are at risk for tuberculosis. Fetal ultrasound. This is an imaging study of your growing baby. It uses sound waves to create pictures of your baby. This test may be done to help determine your due date and to ensure you do not have an ectopic pregnancy. An ectopic pregnancy is a pregnancy that grows outside of the uterus. Tests and screenings done later in pregnancy Certain tests are done for the first time later in the pregnancy. Some of the tests that were done in early pregnancy are repeated at this time. Some common tests you can expect to have later in pregnancy include: Rh antibody testing. If you are Rh negative, you will have a blood test at about 28 weeks of pregnancy to see if you are producing Rh antibodies. If you have not started to make antibodies, you will be given an injection to prevent you from making antibodies for the rest of your pregnancy. Glucose screening. This checks your blood sugar. It will show whether you are developing the type of diabetes that occurs during pregnancy (gestational diabetes). You may have this screening earlier if you have risk factors for diabetes. Screening for group B streptococcus (GBS). GBS is a type of bacteria that may live in your rectum or vagina. GBS can spread to your baby during birth. This  is done at 35-37 weeks of pregnancy. If testing is positive for GBS, you may be treated with antibiotic medicine. Urine and blood tests to monitor for other pregnancy problems, such as preeclampsia or anemia. Blood pressure to monitor for high blood pressure and preeclampsia. Fetal ultrasound. This may be repeated at 16-20 weeks to check how  your baby is growing and developing. Non-stress test. This test is done later in pregnancy to check your baby's heart rate. This may be repeated weekly if your pregnancy is high risk. Biophysical profile. This test includes ultrasound imaging and a non-stress test to ensure your baby is healthy. This test may help decide when your baby should be born. Screening for birth defects Some birth defects are caused by abnormal genes passed down through families. Early in your pregnancy, tests can be done to find out if your baby is at risk for a genetic disorder. This testing is optional. The type of testing recommended for you will depend on your family and medical history, your ethnicity, and your age. Testing may include: Screening tests. These tests may include an ultrasound, blood tests, or a combination of both. The blood tests are used to check for abnormal genes and the ultrasound is done to look for early birth defects. Carrier screening. This test involves checking the blood or saliva of both parents to see if they carry abnormal genes that could be passed down to a baby. If genetic screening shows that your baby is at risk for a genetic defect, additional diagnostic testing may be recommended, such as: Amniocentesis. This involves testing a sample of fluid from your womb (amniotic fluid). Chorionic villus sampling. In this test, a sample of cells from your placenta is checked for abnormal cells. Unlike other tests done during pregnancy, diagnostic testing does have some risk for your pregnancy. Talk to your health care provider about the risks andbenefits of genetic testing. Questions to ask your health care provider What routine tests are recommended for me? When and how will these tests be done? When will I get the results of routine tests? What do the results of these tests mean for me or my baby? Do you recommend any genetic screening tests? Which ones? Should I see a genetic counselor  before having genetic screening? Where to find more information American Pregnancy Association: americanpregnancy.org/prenatal-testing [employer]st[employer]ians [employer] Gynecologists: JewelryExec.com.pt Office on [employer] Health: KeywordPortfolios.com.br March of Dimes: marchofdimes.[email or web address]/pregnancy Summary Having certain tests and screenings during pregnancy is an important part of your prenatal care. Talk to your health care provider about what tests are right for you and your baby. In early pregnancy, testing may be done to check your risks for various conditions that can affect you and your baby. Later in pregnancy, tests may be done to ensure that your baby is growing normally and that you and your baby are staying healthy during the pregnancy. Genetic testing is optional. Talk to your health care provider about the risks and benefits of genetic testing. This information is not intended to replace advice given to you by your health care provider. Make sure you discuss any questions you have with your healthcare provider. Document Revised: 01/28/2020 Document Reviewed: 01/28/2020 Elsevier Patient Education  [place]. Morning Sickness  Morning sickness is when you feel like you may vomit (feel nauseous) during pregnancy. Sometimes, you may vomit. Morning sickness most often happens in the morning, but it can also happen at any time of the day. Some women may have morning  sickness that makes them vomit all the time. This is amore serious problem that needs treatment. What are the causes? The cause of this condition is not known. What increases the risk? You had vomiting or a feeling like you may vomit before your pregnancy. You had morning sickness in another pregnancy. You are pregnant with more than one baby, such as twins. What are the signs or symptoms? Feeling like you may vomit. Vomiting. How is this treated? Treatment is usually not needed for this condition. You may only  need to change what you eat. In some cases, your doctor may give you some things to take for your condition. These include: Vitamin B6 supplements. Medicines to treat the feeling that you may vomit. Ginger. Follow these instructions at home: Medicines Take over-the-counter and prescription medicines only as told by your doctor. Do not take any medicines until you talk with your doctor about them first. Take multivitamins before you get pregnant. These can stop or lessen the symptoms of morning sickness. Eating and drinking Eat dry toast or crackers before getting out of bed. Eat 5 or 6 small meals a day. Eat dry and bland foods like rice and baked potatoes. Do not eat greasy, fatty, or spicy foods. Have someone cook for you if the smell of food causes you to vomit or to feel like you may vomit. If you feel like you may vomit after taking prenatal vitamins, take them at night or with a snack. Eat protein foods when you need a snack. Nuts, yogurt, and cheese are good choices. Drink fluids throughout the day. Try ginger ale made with real ginger, ginger tea made from fresh grated ginger, or ginger candies. General instructions Do not smoke or use any products that contain nicotine or tobacco. If you need help quitting, ask your doctor. Use an air purifier to keep the air in your house free of smells. Get lots of fresh air. Try to avoid smells that make you feel sick. Try wearing an acupressure wristband. This is a wristband that is used to treat seasickness. Try a treatment called acupuncture. In this treatment, a doctor puts needles into certain areas of your body to make you feel better. Contact a doctor if: You need medicine to feel better. You feel dizzy or light-headed. You are losing weight. Get help right away if: The feeling that you may vomit will not go away, or you cannot stop vomiting. You faint. You have very bad pain in your belly. Summary Morning sickness is when you  feel like you may vomit (feel nauseous) during pregnancy. You may feel sick in the morning, but you can feel this way at any time of the day. Making some changes to what you eat may help your symptoms go away. This information is not intended to replace advice given to you by your health care provider. Make sure you discuss any questions you have with your healthcare provider. Document Revised: 12/23/2019 Document Reviewed: 12/02/2019 Elsevier Patient Education  2022 [employer]. How a Baby Grows During Pregnancy Pregnancy begins when a female's sperm enters a female's egg. This is called fertilization. Fertilization usually happens in one of the fallopian tubes that connect the ovaries to the uterus. The fertilized egg moves down the fallopian tube to the uterus. Once it reaches the uterus, it implants into the lining ofthe uterus and begins to grow. For the first 8 weeks, the fertilized egg is called an embryo. After 8 weeks, it is called a fetus. As  the fetus continues to grow, it receives oxygen and nutrients through the placenta, which is an organ that grows to support the developing baby. The placenta is the life support system for the baby. Itprovides oxygen and nutrition and removes waste. How long does a typical pregnancy last? A pregnancy usually lasts 280 days, or about 40 weeks. Pregnancy is divided into three periods of growth, also called trimesters: First trimester: 0-12 weeks. Second trimester: 13-27 weeks. Third trimester: 28-40 weeks. The day when your baby is ready to be born (full term) is your estimated date of delivery. However, most babies are not born ontheir estimated date of delivery. How does my baby develop month by month?  First month The fertilized egg attaches to the inside of the uterus. Some cells will form the placenta. Others will form the fetus. The arms, legs, brain, spinal cord, lungs, and heart begin to develop. At the end of the first month, the heart  begins to beat. Second month The bones, inner ear, eyelids, hands, and feet form. The genitals develop. By the end of 8 weeks, all major organs are developing. Third month All of the internal organs are forming. Teeth develop below the gums. Bones and muscles begin to grow. The spine can flex. The skin is transparent. Fingernails and toenails begin to form. Arms and legs continue to grow longer, and hands and feet develop. The fetus is about 3 inches (7.6 cm) long. Fourth month The placenta is completely formed. The external sex organs, neck, outer ear, eyebrows, eyelids, and fingernails are formed. The fetus can hear, swallow, and move its arms and legs. The kidneys begin to produce urine. The skin is covered with a white, waxy coating (vernix) and very fine hair (lanugo). Fifth month The fetus moves around more and can be felt for the first time (quickening). The fetus starts to sleep and wake up and may begin to suck a finger. The nails grow to the end of the fingers. The organ in the digestive system that makes bile (gallbladder) functions and helps to digest nutrients. If the fetus is a female, eggs are present in the ovaries. If the fetus is a female, testicles start to move down into the scrotum. Sixth month The lungs are formed. The eyes open. The brain continues to develop. Your baby has fingerprints and toe prints. Your baby's hair grows thicker. At the end of the second trimester, the fetus is about 9 inches (22.9 cm) long. Seventh month The fetus kicks and stretches. The eyes are developed enough to sense changes in light. The hands can make a grasping motion. The fetus responds to sound. Eighth month Most organs and body systems are fully developed and functioning. Bones harden, and taste buds develop. The fetus may hiccup. Certain areas of the brain are still developing. The skull remains soft. Ninth month The fetus gains about  lb (0.23 kg) each week. The lungs  are fully developed. Patterns of sleep develop. The fetus's head typically moves into a head-down position (vertex) in the uterus to prepare for birth. The fetus weighs 6-9 lb (2.72-4.08 kg) and is 19-20 inches (48.26-50.8 cm) long. How do I know if my baby is developing well? Always talk with your health care provider about any concerns that you may have about your pregnancy and your baby. At each prenatal visit, your health care provider will do several different tests to check on your health and keep track of your baby's development. These include: Fundal height and position.  To do this, your health care provider will: Measure your growing belly from your pubic bone to the top of the uterus using a tape measure. Feel your belly to determine your baby's position. Heartbeat. An ultrasound in the first trimester can confirm pregnancy and show a heartbeat, depending on how far along you are. Your health care provider will check your baby's heart rate at every prenatal visit. You will also have a second trimester ultrasound to check your baby'sdevelopment. Follow these instructions at home: Take prenatal vitamins as told by your health care provider. These include vitamins such as folic acid, iron, calcium, and vitamin D. They are important for healthy development. Take over-the-counter and prescription medicines only as told by your health care provider. Keep all follow-up visits. This is important. Follow-up visits include prenatal care and screening tests. Summary A pregnancy usually lasts 280 days, or about 40 weeks. Pregnancy is divided into three periods of growth, also called trimesters. Your health care provider will monitor your baby's growth and development throughout your pregnancy. Follow your health care provider's recommendations about taking prenatal vitamins and medicines during your pregnancy. Talk with your health care provider if you have any concerns about your pregnancy or your  developing baby. This information is not intended to replace advice given to you by your health care provider. Make sure you discuss any questions you have with your healthcare provider. Document Revised: 10/16/2019 Document Reviewed: 08/22/2019 Elsevier Patient Education  [place]. [email or web address]">  First Trimester of Pregnancy  The first trimester of pregnancy starts on the first day of your last menstrual period until the end of week 12. This is also called months 1 through 3 ofpregnancy. Body changes during your first trimester Your body goes through many changes during pregnancy. The changes usuallyreturn to normal after your baby is born. Physical changes You may gain or lose weight. Your breasts may grow larger and hurt. The area around your nipples may get darker. Dark spots or blotches may develop on your face. You may have changes in your hair. Health changes You may feel like you might vomit (nauseous), and you may vomit. You may have heartburn. You may have headaches. You may have trouble pooping (constipation). Your gums may bleed. Other changes You may get tired easily. You may pee (urinate) more often. Your menstrual periods will stop. You may not feel hungry. You may want to eat certain kinds of food. You may have changes in your emotions from day to day. You may have more dreams. Follow these instructions at home: Medicines Take over-the-counter and prescription medicines only as told by your doctor. Some medicines are not safe during pregnancy. Take a prenatal vitamin that contains at least 600 micrograms (mcg) of folic acid. Eating and drinking Eat healthy meals that include: Fresh fruits and vegetables. Whole grains. Good sources of protein, such as meat, eggs, or tofu. Low-fat dairy products. Avoid raw meat and unpasteurized juice, milk, and cheese. If you feel like you may vomit, or you vomit: Eat 4 or 5 small  meals a day instead of 3 large meals. Try eating a few soda crackers. Drink liquids between meals instead of during meals. You may need to take these actions to prevent or treat trouble pooping: Drink enough fluids to keep your pee (urine) pale yellow. Eat foods that are high in fiber. These include beans, whole grains, and fresh fruits and vegetables. Limit foods that are high in fat and sugar. These include fried or sweet foods.  Activity Exercise only as told by your doctor. Most people can do their usual exercise routine during pregnancy. Stop exercising if you have cramps or pain in your lower belly (abdomen) or low back. Do not exercise if it is too hot or too humid, or if you are in a place of great height (high altitude). Avoid heavy lifting. If you choose to, you may have sex unless your doctor tells you not to. Relieving pain and discomfort Wear a good support bra if your breasts are sore. Rest with your legs raised (elevated) if you have leg cramps or low back pain. If you have bulging veins (varicose veins) in your legs: Wear support hose as told by your doctor. Raise your feet for 15 minutes, 3-4 times a day. Limit salt in your food. Safety Wear your seat belt at all times when you are in a car. Talk with your doctor if someone is hurting you or yelling at you. Talk with your doctor if you are feeling sad or have thoughts of hurting yourself. Lifestyle Do not use hot tubs, steam rooms, or saunas. Do not douche. Do not use tampons or scented sanitary pads. Do not use herbal medicines, illegal drugs, or medicines that are not approved by your doctor. Do not drink alcohol. Do not smoke or use any products that contain nicotine or tobacco. If you need help quitting, ask your doctor. Avoid cat litter boxes and soil that is used by cats. These carry germs that can cause harm to the baby and can cause a loss of your baby by miscarriage or stillbirth. General instructions Keep all  follow-up visits. This is important. Ask for help if you need counseling or if you need help with nutrition. Your doctor can give you advice or tell you where to go for help. Visit your dentist. At home, brush your teeth with a soft toothbrush. Floss gently. Write down your questions. Take them to your prenatal visits. Where to find more information American Pregnancy Association: americanpregnancy.org [employer]ians [employer]necologists: www.acog.org Office on Women's Health: KeywordPortfolios.com.br Contact a doctor if: You are dizzy. You have a fever. You have mild cramps or pressure in your lower belly. You have a nagging pain in your belly area. You continue to feel like you may vomit, you vomit, or you have watery poop (diarrhea) for 24 hours or longer. You have a bad-smelling fluid coming from your vagina. You have pain when you pee. You are exposed to a disease that spreads from person to person, such as chickenpox, measles, Zika virus, HIV, or hepatitis. Get help right away if: You have spotting or bleeding from your vagina. You have very bad belly cramping or pain. You have shortness of breath or chest pain. You have any kind of injury, such as from a fall or a car crash. You have new or increased pain, swelling, or redness in an arm or leg. Summary The first trimester of pregnancy starts on the first day of your last menstrual period until the end of week 12 (months 1 through 3). Eat 4 or 5 small meals a day instead of 3 large meals. Do not smoke or use any products that contain nicotine or tobacco. If you need help quitting, ask your doctor. Keep all follow-up visits. This information is not intended to replace advice given to you by your health care provider. Make sure you discuss any questions you have with your healthcare provider. Document Revised: 10/16/2019 Document Reviewed: 08/22/2019 Elsevier Patient  Education  2022 [employer]. Commonly Asked  Questions During Pregnancy  Cats: A parasite can be excreted in cat feces.  To avoid exposure you need to have another person empty the little box.  If you must empty the litter box you will need to wear gloves.  Wash your hands after handling your cat.  This parasite can also be found in raw or undercooked meat so this should also be avoided.  Colds, Sore Throats, Flu: Please check your medication sheet to see what you can take for symptoms.  If your symptoms are unrelieved by these medications please call the office.  Dental Work: Most any dental work Investment banker, corporate recommends is permitted.  X-rays should only be taken during the first trimester if absolutely necessary.  Your abdomen should be shielded with a lead apron during all x-rays.  Please notify your provider prior to receiving any x-rays.  Novocaine is fine; gas is not recommended.  If your dentist requires a note from [place] prior to dental work please call the office and we will provide one for you.  Exercise: Exercise is an important part of staying healthy during your pregnancy.  You may continue most exercises you were accustomed to prior to pregnancy.  Later in your pregnancy you will most likely notice you have difficulty with activities requiring balance like riding a bicycle.  It is important that you listen to your body and avoid activities that put you at a higher risk of falling.  Adequate rest and staying well hydrated are a must!  If you have questions about the safety of specific activities ask your provider.    Exposure to Children with illness: Try to avoid obvious exposure; report any symptoms to [place] when noted,  If you have chicken pos, red measles or mumps, you should be immune to these diseases.   Please do not take any vaccines while pregnant unless you have checked with your OB provider.  Fetal Movement: After 28 weeks we recommend you do "kick counts" twice daily.  Lie or sit down in a calm quiet environment and count your  baby movements "kicks".  You should feel your baby at least 10 times per hour.  If you have not felt 10 kicks within the first hour get up, walk around and have something sweet to eat or drink then repeat for an additional hour.  If count remains less than 10 per hour notify your provider.  Fumigating: Follow your pest control agent's advice as to how long to stay out of your home.  Ventilate the area well before re-entering.  Hemorrhoids:   Most over-the-counter preparations can be used during pregnancy.  Check your medication to see what is safe to use.  It is important to use a stool softener or fiber in your diet and to drink lots of liquids.  If hemorrhoids seem to be getting worse please call the office.   Hot Tubs:  Hot tubs Jacuzzis and saunas are not recommended while pregnant.  These increase your internal body temperature and should be avoided.  Intercourse:  Sexual intercourse is safe during pregnancy as long as you are comfortable, unless otherwise advised by your provider.  Spotting may occur after intercourse; report any bright red bleeding that is heavier than spotting.  Labor:  If you know that you are in labor, please go to the hospital.  If you are unsure, please call the office and let us help you decide what to do.  Lifting, straining, etc:  If your job requires heavy lifting or straining please check with your provider for any limitations.  Generally, you should not lift items heavier than that you can lift simply with your hands and arms (no back muscles)  Painting:  Paint fumes do not harm your pregnancy, but may make you ill and should be avoided if possible.  Latex or water based paints have less odor than oils.  Use adequate ventilation while painting.  Permanents & Hair Color:  Chemicals in hair dyes are not recommended as they cause increase hair dryness which can increase hair loss during pregnancy.  " Highlighting" and permanents are allowed.  Dye may be absorbed  differently and permanents may not hold as well during pregnancy.  Sunbathing:  Use a sunscreen, as skin burns easily during pregnancy.  Drink plenty of fluids; avoid over heating.  Tanning Beds:  Because their possible side effects are still unknown, tanning beds are not recommended.  Ultrasound Scans:  Routine ultrasounds are performed at approximately 20 weeks.  You will be able to see your baby's general anatomy an if you would like to know the gender this can usually be determined as well.  If it is questionable when you conceived you may also receive an ultrasound early in your pregnancy for dating purposes.  Otherwise ultrasound exams are not routinely performed unless there is a medical necessity.  Although you can request a scan we ask that you pay for it when conducted because insurance does not cover " patient request" scans.  Work: If your pregnancy proceeds without complications you may work until your due date, unless your physician or employer advises otherwise.  Round Ligament Pain/Pelvic Discomfort:  Sharp, shooting pains not associated with bleeding are fairly common, usually occurring in the second trimester of pregnancy.  They tend to be worse when standing up or when you remain standing for long periods of time.  These are the result of pressure of certain pelvic ligaments called "round ligaments".  Rest, Tylenol and heat seem to be the most effective relief.  As the womb and fetus grow, they rise out of the pelvis and the discomfort improves.  Please notify the office if your pain seems different than that described.  It may represent a more serious condition.  Common Medications Safe in Pregnancy  Acne:      Constipation:  Benzoyl Peroxide     Colace  Clindamycin      Dulcolax Suppository  Topica Erythromycin     Fibercon  Salicylic Acid      Metamucil         Miralax AVOID:        Senakot   Accutane    Cough:  Retin-A       Cough Drops  Tetracycline      Phenergan w/  Codeine if Rx  Minocycline      Robitussin (Plain & DM)  Antibiotics:     Crabs/Lice:  Ceclor       RID  Cephalosporins    AVOID:  E-Mycins      Kwell  Keflex  Macrobid/Macrodantin   Diarrhea:  Penicillin      Kao-Pectate  Zithromax      Imodium AD         PUSH FLUIDS AVOID:       Cipro     Fever:  Tetracycline      Tylenol (Regular or Extra  Minocycline       Strength)  Levaquin  Extra Strength-Do not          Exceed 8 tabs/24 hrs Caffeine:        '200mg'$ /day (equiv. To 1 cup of coffee or  approx. 3 12 oz sodas)         Gas: Cold/Hayfever:       Gas-X  Benadryl      Mylicon  Claritin       Phazyme  **Claritin-D        Chlor-Trimeton    Headaches:  Dimetapp      ASA-Free Excedrin  Drixoral-Non-Drowsy     Cold Compress  Mucinex (Guaifenasin)     Tylenol (Regular or Extra  Sudafed/Sudafed-12 Hour     Strength)  **Sudafed PE Pseudoephedrine   Tylenol Cold & Sinus     Vicks Vapor Rub  Zyrtec  **AVOID if Problems With Blood Pressure         Heartburn: Avoid lying down for at least 1 hour after meals  Aciphex      Maalox     Rash:  Milk of Magnesia     Benadryl    Mylanta       1% Hydrocortisone Cream  Pepcid  Pepcid Complete   Sleep Aids:  Prevacid      Ambien   Prilosec       Benadryl  Rolaids       Chamomile Tea  Tums (Limit 4/day)     Unisom         Tylenol PM         Warm milk-add vanilla or  Hemorrhoids:       Sugar for taste  Anusol/Anusol H.C.  (RX: Analapram 2.5%)  Sugar Substitutes:  Hydrocortisone OTC     Ok in moderation  Preparation H      Tucks        Vaseline lotion applied to tissue with wiping    Herpes:     Throat:  Acyclovir      Oragel  Famvir  Valtrex     Vaccines:         Flu Shot Leg Cramps:       *Gardasil  Benadryl      Hepatitis A         Hepatitis B Nasal Spray:       Pneumovax  Saline Nasal Spray     Polio Booster         Tetanus Nausea:       Tuberculosis test or PPD  Vitamin B6 25 mg  TID   AVOID:    Dramamine      *Gardasil  Emetrol       Live Poliovirus  Ginger Root 250 mg QID    MMR (measles, mumps &  High Complex Carbs @ Bedtime    rebella)  Sea Bands-Accupressure    Varicella (Chickenpox)  Unisom 1/2 tab TID     *No known complications           If received before Pain:         Known pregnancy;   Darvocet       Resume series after  Lortab        Delivery  Percocet    Yeast:   Tramadol      Femstat  Tylenol 3      Gyne-lotrimin  Ultram       Monistat  Vicodin           MISC:  All Sunscreens           Hair Coloring/highlights          Insect Repellant's          (Including DEET)         Mystic Tans

## 2020-11-02 NOTE — Progress Notes (Signed)
      Christine Mcbride presents for NOB nurse intake visit. Pregnancy confirmation done at [facility], 10/20/2020, with Linzie Collin.  G1.  P0.  LMP 09/09/2020 appx with then days.  EDD 06/16/2021.  Ga [redacted]w[redacted]d. Pregnancy education material explained and given.  0 cats/ 3 dogs in the home.  NOB labs ordered. BMI less than 30. TSH/HbgA1c not ordered. Sickle cell not ordered due to race. HIV and drug screen explained and ordered. Genetic screening discussed. Genetic testing; Pt is aware that she will be having the MaterniT21 during her NOB PE with the provider.  Pt to discuss genetic testing with provider. PNV encouraged. Pt to follow up with provider in 1 weeks for NOB physical.  FMLA, Drug/HIV Screening, [facility] Financial Policy reviewed with pt and all forms signed.  Pt stated having left ovary pain and pressure and the pain is so bad that she is unable to walk.

## 2020-11-03 LAB — URINALYSIS, ROUTINE W REFLEX MICROSCOPIC
Bilirubin, UA: NEGATIVE
Glucose, UA: NEGATIVE
Ketones, UA: NEGATIVE
Leukocytes,UA: NEGATIVE
Nitrite, UA: NEGATIVE
Protein,UA: NEGATIVE
RBC, UA: NEGATIVE
Specific Gravity, UA: 1.015 (ref 1.005–1.030)
Urobilinogen, Ur: 0.2 mg/dL (ref 0.2–1.0)
pH, UA: 7 (ref 5.0–7.5)

## 2020-11-04 LAB — ABO AND RH: Rh Factor: NEGATIVE

## 2020-11-04 LAB — CULTURE, OB URINE

## 2020-11-04 LAB — TOXOPLASMA ANTIBODIES- IGG AND  IGM
Toxoplasma Antibody- IgM: <3 AU/mL (ref 0.0–7.9)
Toxoplasma IgG Ratio: <3 IU/mL (ref 0.0–7.1)

## 2020-11-04 LAB — VIRAL HEPATITIS HBV, HCV
HCV Ab: <0.1 s/co ratio (ref 0.0–0.9)
Hep B Core Total Ab: NEGATIVE
Hep B Surface Ab, Qual: NONREACTIVE
Hepatitis B Surface Ag: NEGATIVE

## 2020-11-04 LAB — VARICELLA ZOSTER ANTIBODY, IGG: Varicella zoster IgG: <135 index — ABNORMAL LOW (ref >165)

## 2020-11-04 LAB — HIV ANTIBODY (ROUTINE TESTING W REFLEX): HIV Screen 4th Generation wRfx: NONREACTIVE

## 2020-11-04 LAB — ANTIBODY SCREEN: Antibody Screen: NEGATIVE

## 2020-11-04 LAB — HCV INTERPRETATION

## 2020-11-04 LAB — RPR: RPR Ser Ql: NONREACTIVE

## 2020-11-04 LAB — URINE CULTURE, OB REFLEX

## 2020-11-04 LAB — GC/CHLAMYDIA PROBE AMP
Chlamydia trachomatis, NAA: NEGATIVE
Neisseria Gonorrhoeae by PCR: NEGATIVE

## 2020-11-04 LAB — RUBELLA SCREEN: Rubella Antibodies, IGG: 1.17 index (ref >0.99)

## 2020-11-09 ENCOUNTER — Ambulatory Visit
Admission: RE | Admit: 2020-11-09 | Discharge: 2020-11-09 | Disposition: A | Discharge status: Home / Self Care | Payer: BC Managed Care – PPO | Source: Ambulatory Visit | Attending: Obstetrics and Gynecology | Admitting: Obstetrics and Gynecology

## 2020-11-09 ENCOUNTER — Other Ambulatory Visit: Payer: Self-pay

## 2020-11-09 DIAGNOSIS — Z3A01 Less than 8 weeks gestation of pregnancy: Secondary | ICD-10-CM | POA: Diagnosis not present

## 2020-11-09 DIAGNOSIS — R102 Pelvic and perineal pain: Secondary | ICD-10-CM | POA: Diagnosis not present

## 2020-11-09 DIAGNOSIS — O26841 Uterine size-date discrepancy, first trimester: Secondary | ICD-10-CM | POA: Diagnosis not present

## 2020-11-12 ENCOUNTER — Ambulatory Visit (INDEPENDENT_AMBULATORY_CARE_PROVIDER_SITE_OTHER): Payer: BC Managed Care – PPO | Admitting: Obstetrics and Gynecology

## 2020-11-12 ENCOUNTER — Other Ambulatory Visit: Payer: Self-pay

## 2020-11-12 VITALS — BP 103/66 | HR 71 | Wt 140.1 lb

## 2020-11-12 DIAGNOSIS — Z3401 Encounter for supervision of normal first pregnancy, first trimester: Secondary | ICD-10-CM

## 2020-11-12 DIAGNOSIS — Z3A09 9 weeks gestation of pregnancy: Secondary | ICD-10-CM

## 2020-11-12 LAB — POCT URINALYSIS DIPSTICK
Bilirubin, UA: NEGATIVE
Glucose, UA: NEGATIVE
Ketones, UA: NEGATIVE
Leukocytes, UA: NEGATIVE
Nitrite, UA: NEGATIVE
Protein, UA: NEGATIVE
Spec Grav, UA: 1.01 (ref 1.010–1.025)
Urobilinogen, UA: 0.2 E.U./dL
pH, UA: 7.5 (ref 5.0–8.0)

## 2020-11-12 MED ORDER — VITAFOL GUMMIES 3.33-0.333-34.8 MG PO CHEW: CHEWABLE_TABLET | ORAL | 11 refills | Status: DC

## 2020-11-12 NOTE — Progress Notes (Signed)
NOB: EDC changed based on ultrasound.  Patient has occasional nausea but not vomiting.  Desires genetic testing at next visit.  Physical examination General NAD, Conversant  HEENT Atraumatic; Op clear with mmm.  Normo-cephalic. Pupils reactive. Anicteric sclerae  Thyroid/Neck Smooth without nodularity or enlargement. Normal ROM.  Neck Supple.  Skin No rashes, lesions or ulceration. Normal palpated skin turgor. No nodularity.  Breasts: No masses or discharge.  Symmetric.  No axillary adenopathy.  Lungs: Clear to auscultation.No rales or wheezes. Normal Respiratory effort, no retractions.  Heart: NSR.  No murmurs or rubs appreciated. No periferal edema  Abdomen: Soft.  Non-tender.  No masses.  No HSM. No hernia  Extremities: Moves all appropriately.  Normal ROM for age. No lymphadenopathy.  Neuro: Oriented to PPT.  Normal mood. Normal affect.     Pelvic:   Vulva: Normal appearance.  No lesions.  Vagina: No lesions or abnormalities noted.  Support: Normal pelvic support.  Urethra No masses tenderness or scarring.  Meatus Normal size without lesions or prolapse.  Cervix: Normal appearance.  No lesions.  Anus: Normal exam.  No lesions.  Perineum: Normal exam.  No lesions.        Bimanual   Adnexae: No masses.  Non-tender to palpation.  Uterus: Enlarged.   Non-tender.  Mobile.  AV.  Adnexae: No masses.  Non-tender to palpation.  Cul-de-sac: Negative for abnormality.  Adnexae: No masses.  Non-tender to palpation.         Pelvimetry   Diagonal: Reached.  Spines: Average.  Sacrum: Concave.  Pubic Arch: Normal.

## 2020-11-13 ENCOUNTER — Other Ambulatory Visit: Payer: Self-pay

## 2020-11-13 ENCOUNTER — Telehealth: Payer: Self-pay

## 2020-11-13 NOTE — Telephone Encounter (Signed)
LM informing pt that vitafol gummies are not covered.   Asked pt to contact office to see is she would like a different PNV.

## 2020-11-17 ENCOUNTER — Encounter: Payer: BC Managed Care – PPO | Admitting: Obstetrics and Gynecology

## 2020-11-18 LAB — DRUG PROFILE, UR, 9 DRUGS (LABCORP)
Amphetamines, Urine: NEGATIVE ng/mL
Barbiturate Quant, Ur: NEGATIVE ng/mL
Benzodiazepine Quant, Ur: NEGATIVE ng/mL
Cannabinoid Quant, Ur: POSITIVE — AB
Cocaine (Metab.): NEGATIVE ng/mL
Methadone Screen, Urine: NEGATIVE ng/mL
Opiate Quant, Ur: NEGATIVE ng/mL
PCP Quant, Ur: NEGATIVE ng/mL
Propoxyphene: NEGATIVE ng/mL

## 2020-11-18 LAB — NICOTINE SCREEN, URINE: Cotinine Ql Scrn, Ur: POSITIVE ng/mL — AB

## 2020-12-08 ENCOUNTER — Other Ambulatory Visit: Payer: 59

## 2020-12-10 ENCOUNTER — Ambulatory Visit (INDEPENDENT_AMBULATORY_CARE_PROVIDER_SITE_OTHER): Payer: BC Managed Care – PPO | Admitting: Obstetrics and Gynecology

## 2020-12-10 ENCOUNTER — Other Ambulatory Visit: Payer: Self-pay

## 2020-12-10 ENCOUNTER — Encounter: Payer: Self-pay | Admitting: Obstetrics and Gynecology

## 2020-12-10 VITALS — BP 126/76 | HR 81 | Wt 142.1 lb

## 2020-12-10 DIAGNOSIS — Z3A11 11 weeks gestation of pregnancy: Secondary | ICD-10-CM

## 2020-12-10 DIAGNOSIS — Z3401 Encounter for supervision of normal first pregnancy, first trimester: Secondary | ICD-10-CM | POA: Diagnosis not present

## 2020-12-10 LAB — POCT URINALYSIS DIPSTICK OB
Bilirubin, UA: NEGATIVE
Blood, UA: NEGATIVE
Glucose, UA: NEGATIVE
Ketones, UA: NEGATIVE
Leukocytes, UA: NEGATIVE
Nitrite, UA: NEGATIVE
POC,PROTEIN,UA: NEGATIVE
Spec Grav, UA: 1.01 (ref 1.010–1.025)
Urobilinogen, UA: 0.2 E.U./dL
pH, UA: 6.5 (ref 5.0–8.0)

## 2020-12-10 NOTE — Progress Notes (Signed)
[name]: Patient somewhat overwhelmed about being pregnant and her ability to care for herself her dogs and her new baby.  Additionally, she would have to do this "alone" as she thinks the father of the baby will not be very involved.  She was considering abortion but left the clinic without being seen.  She has spoken with her parents and her friends regarding this decision but she states that ultimately she " has to do what is best for her."  We discussed our options at length.  She wants to hear the fetal heart rate today and seems generally excited to hear it.  She is requesting genetic testing today.  aFP next visit

## 2020-12-14 LAB — MATERNIT21  PLUS CORE+ESS+SCA, BLOOD

## 2021-01-11 NOTE — Patient Instructions (Addendum)
Second Trimester of Pregnancy  The second trimester of pregnancy is from week 13 through week 27. This is also called months 4 through 6 of pregnancy. This is often the time when you feelyour best. During the second trimester: Morning sickness is less or has stopped. You may have more energy. You may feel hungry more often. At this time, your unborn baby (fetus) is growing very fast. At the end of the sixth month, the unborn baby may be up to 12 inches long and weigh about 1 pounds. You will likely start to feelthe baby move between 16 and 20 weeks of pregnancy. Body changes during your second trimester Your body continues to go through many changes during this time. The changesvary and generally return to normal after the baby is born. Physical changes You will gain more weight. You may start to get stretch marks on your hips, belly (abdomen), and breasts. Your breasts will grow and may hurt. Dark spots or blotches may develop on your face. A dark line from your belly button to the pubic area (linea nigra) may appear. You may have changes in your hair. Health changes You may have headaches. You may have heartburn. You may have trouble pooping (constipation). You may have hemorrhoids or swollen, bulging veins (varicose veins). Your gums may bleed. You may pee (urinate) more often. You may have back pain. Follow these instructions at home: Medicines Take over-the-counter and prescription medicines only as told by your doctor. Some medicines are not safe during pregnancy. Take a prenatal vitamin that contains at least 600 micrograms (mcg) of folic acid. Eating and drinking Eat healthy meals that include: Fresh fruits and vegetables. Whole grains. Good sources of protein, such as meat, eggs, or tofu. Low-fat dairy products. Avoid raw meat and unpasteurized juice, milk, and cheese. You may need to take these actions to prevent or treat trouble pooping: Drink enough fluids to keep  your pee (urine) pale yellow. Eat foods that are high in fiber. These include beans, whole grains, and fresh fruits and vegetables. Limit foods that are high in fat and sugar. These include fried or sweet foods. Activity Exercise only as told by your doctor. Most people can do their usual exercise during pregnancy. Try to exercise for 30 minutes at least 5 days a week. Stop exercising if you have pain or cramps in your belly or lower back. Do not exercise if it is too hot or too humid, or if you are in a place of great height (high altitude). Avoid heavy lifting. If you choose to, you may have sex unless your doctor tells you not to. Relieving pain and discomfort Wear a good support bra if your breasts are sore. Take warm water baths (sitz baths) to soothe pain or discomfort caused by hemorrhoids. Use hemorrhoid cream if your doctor approves. Rest with your legs raised (elevated) if you have leg cramps or low back pain. If you develop bulging veins in your legs: Wear support hose as told by your doctor. Raise your feet for 15 minutes, 3-4 times a day. Limit salt in your food. Safety Wear your seat belt at all times when you are in a car. Talk with your doctor if someone is hurting you or yelling at you a lot. Lifestyle Do not use hot tubs, steam rooms, or saunas. Do not douche. Do not use tampons or scented sanitary pads. Avoid cat litter boxes and soil used by cats. These carry germs that can harm your baby and can cause   can cause a loss of your baby by miscarriage or stillbirth. Do not use herbal medicines, illegal drugs, or medicines that are not approved by your doctor. Do not drink alcohol. Do not smoke or use any products that contain nicotine or tobacco. If you need help quitting, ask your doctor. General instructions Keep all follow-up visits. This is important. Ask your doctor about local prenatal classes. Ask your doctor about the right foods to eat or for help finding a  counselor. Where to find more information American Pregnancy Association: americanpregnancy.org American College of Obstetricians and Gynecologists: www.acog.org Office on Women's Health: womenshealth.gov/pregnancy Contact a doctor if: You have a headache that does not go away when you take medicine. You have changes in how you see, or you see spots in front of your eyes. You have mild cramps, pressure, or pain in your lower belly. You continue to feel like you may vomit (nauseous), you vomit, or you have watery poop (diarrhea). You have bad-smelling fluid coming from your vagina. You have pain when you pee or your pee smells bad. You have very bad swelling of your face, hands, ankles, feet, or legs. You have a fever. Get help right away if: You are leaking fluid from your vagina. You have spotting or bleeding from your vagina. You have very bad belly cramping or pain. You have trouble breathing. You have chest pain. You faint. You have not felt your baby move for the time period told by your doctor. You have new or increased pain, swelling, or redness in an arm or leg. Summary The second trimester of pregnancy is from week 13 through week 27 (months 4 through 6). Eat healthy meals. Exercise as told by your doctor. Most people can do their usual exercise during pregnancy. Do not use herbal medicines, illegal drugs, or medicines that are not approved by your doctor. Do not drink alcohol. Call your doctor if you get sick or if you notice anything unusual about your pregnancy. This information is not intended to replace advice given to you by your health care provider. Make sure you discuss any questions you have with your health care provider. Document Revised: 10/16/2019 Document Reviewed: 08/22/2019 Elsevier Patient Education  2022 Elsevier Inc. Common Medications Safe in Pregnancy  Acne:      Constipation:  Benzoyl Peroxide     Colace  Clindamycin      Dulcolax  Suppository  Topica Erythromycin     Fibercon  Salicylic Acid      Metamucil         Miralax AVOID:        Senakot   Accutane    Cough:  Retin-A       Cough Drops  Tetracycline      Phenergan w/ Codeine if Rx  Minocycline      Robitussin (Plain & DM)  Antibiotics:     Crabs/Lice:  Ceclor       RID  Cephalosporins    AVOID:  E-Mycins      Kwell  Keflex  Macrobid/Macrodantin   Diarrhea:  Penicillin      Kao-Pectate  Zithromax      Imodium AD         PUSH FLUIDS AVOID:       Cipro     Fever:  Tetracycline      Tylenol (Regular or Extra  Minocycline       Strength)  Levaquin      Extra Strength-Do not            Exceed 8 tabs/24 hrs Caffeine:        <200mg/day (equiv. To 1 cup of coffee or  approx. 3 12 oz sodas)         Gas: Cold/Hayfever:       Gas-X  Benadryl      Mylicon  Claritin       Phazyme  **Claritin-D        Chlor-Trimeton    Headaches:  Dimetapp      ASA-Free Excedrin  Drixoral-Non-Drowsy     Cold Compress  Mucinex (Guaifenasin)     Tylenol (Regular or Extra  Sudafed/Sudafed-12 Hour     Strength)  **Sudafed PE Pseudoephedrine   Tylenol Cold & Sinus     Vicks Vapor Rub  Zyrtec  **AVOID if Problems With Blood Pressure         Heartburn: Avoid lying down for at least 1 hour after meals  Aciphex      Maalox     Rash:  Milk of Magnesia     Benadryl    Mylanta       1% Hydrocortisone Cream  Pepcid  Pepcid Complete   Sleep Aids:  Prevacid      Ambien   Prilosec       Benadryl  Rolaids       Chamomile Tea  Tums (Limit 4/day)     Unisom         Tylenol PM         Warm milk-add vanilla or  Hemorrhoids:       Sugar for taste  Anusol/Anusol H.C.  (RX: Analapram 2.5%)  Sugar Substitutes:  Hydrocortisone OTC     Ok in moderation  Preparation H      Tucks        Vaseline lotion applied to tissue with wiping    Herpes:     Throat:  Acyclovir      Oragel  Famvir  Valtrex     Vaccines:         Flu Shot Leg  Cramps:       *Gardasil  Benadryl      Hepatitis A         Hepatitis B Nasal Spray:       Pneumovax  Saline Nasal Spray     Polio Booster         Tetanus Nausea:       Tuberculosis test or PPD  Vitamin B6 25 mg TID   AVOID:    Dramamine      *Gardasil  Emetrol       Live Poliovirus  Ginger Root 250 mg QID    MMR (measles, mumps &  High Complex Carbs @ Bedtime    rebella)  Sea Bands-Accupressure    Varicella (Chickenpox)  Unisom 1/2 tab TID     *No known complications           If received before Pain:         Known pregnancy;   Darvocet       Resume series after  Lortab        Delivery  Percocet    Yeast:   Tramadol      Femstat  Tylenol 3      Gyne-lotrimin  Ultram       Monistat  Vicodin           MISC:         All Sunscreens             Hair Coloring/highlights          Insect Repellant's          (Including DEET)         Mystic Tans  

## 2021-01-12 ENCOUNTER — Ambulatory Visit (INDEPENDENT_AMBULATORY_CARE_PROVIDER_SITE_OTHER): Payer: BC Managed Care – PPO | Admitting: Obstetrics and Gynecology

## 2021-01-12 ENCOUNTER — Other Ambulatory Visit: Payer: Self-pay

## 2021-01-12 ENCOUNTER — Encounter: Payer: Self-pay | Admitting: Obstetrics and Gynecology

## 2021-01-12 VITALS — BP 110/72 | HR 96 | Wt 142.1 lb

## 2021-01-12 DIAGNOSIS — Z1379 Encounter for other screening for genetic and chromosomal anomalies: Secondary | ICD-10-CM | POA: Diagnosis not present

## 2021-01-12 DIAGNOSIS — Z8669 Personal history of other diseases of the nervous system and sense organs: Secondary | ICD-10-CM | POA: Insufficient documentation

## 2021-01-12 DIAGNOSIS — Z6791 Unspecified blood type, Rh negative: Secondary | ICD-10-CM

## 2021-01-12 DIAGNOSIS — Z3402 Encounter for supervision of normal first pregnancy, second trimester: Secondary | ICD-10-CM

## 2021-01-12 DIAGNOSIS — O99619 Diseases of the digestive system complicating pregnancy, unspecified trimester: Secondary | ICD-10-CM

## 2021-01-12 DIAGNOSIS — O26899 Other specified pregnancy related conditions, unspecified trimester: Secondary | ICD-10-CM | POA: Insufficient documentation

## 2021-01-12 DIAGNOSIS — K219 Gastro-esophageal reflux disease without esophagitis: Secondary | ICD-10-CM | POA: Insufficient documentation

## 2021-01-12 DIAGNOSIS — Z3A16 16 weeks gestation of pregnancy: Secondary | ICD-10-CM

## 2021-01-12 LAB — POCT URINALYSIS DIPSTICK OB
Bilirubin, UA: NEGATIVE
Blood, UA: NEGATIVE
Glucose, UA: NEGATIVE
Ketones, UA: NEGATIVE
Nitrite, UA: NEGATIVE
POC,PROTEIN,UA: NEGATIVE
Spec Grav, UA: 1.01 (ref 1.010–1.025)
Urobilinogen, UA: 0.2 E.U./dL
pH, UA: 7 (ref 5.0–8.0)

## 2021-01-12 NOTE — Progress Notes (Signed)
Reports headaches.  Notes that she wakes up the morning, and then it comes and goes. Does note h/o of seasonal allergies and migraines in the past. Also complaining of reflux. Recommended OTC remedies for management. Anatomy scan ordered. Normal MaterniT21, for AFP today.  Rh neg, discussed need for Rhogam in pregnancy. Reviewed expectations of second trimester.  RTC in 4 weeks.  The patient has Medicaid.  [employer] Medicaid Risk Screening Form completed today

## 2021-01-12 NOTE — Progress Notes (Signed)
OB-Pt present for routine prenatal care. Pt c/o pain when she has a BM.

## 2021-01-19 LAB — AFP, SERUM, OPEN SPINA BIFIDA
AFP MoM: 1.15
AFP Value: 45.4 ng/mL
Gest. Age on Collection Date: 16.6 weeks
Maternal Age At EDD: 25.2 yr
OSBR Risk 1 IN: 7603
Test Results:: NEGATIVE
Weight: 142 [lb_av]

## 2021-01-22 ENCOUNTER — Emergency Department
Admission: EM | Admit: 2021-01-22 | Discharge: 2021-01-22 | Disposition: A | Discharge status: Home / Self Care | Payer: BC Managed Care – PPO | Attending: Emergency Medicine | Admitting: Emergency Medicine

## 2021-01-22 ENCOUNTER — Other Ambulatory Visit: Payer: Self-pay

## 2021-01-22 ENCOUNTER — Emergency Department: Payer: BC Managed Care – PPO

## 2021-01-22 DIAGNOSIS — R1032 Left lower quadrant pain: Secondary | ICD-10-CM | POA: Diagnosis not present

## 2021-01-22 DIAGNOSIS — J45909 Unspecified asthma, uncomplicated: Secondary | ICD-10-CM | POA: Diagnosis not present

## 2021-01-22 DIAGNOSIS — R109 Unspecified abdominal pain: Secondary | ICD-10-CM

## 2021-01-22 DIAGNOSIS — N200 Calculus of kidney: Secondary | ICD-10-CM | POA: Insufficient documentation

## 2021-01-22 DIAGNOSIS — O26899 Other specified pregnancy related conditions, unspecified trimester: Secondary | ICD-10-CM | POA: Diagnosis not present

## 2021-01-22 DIAGNOSIS — Z3A10 10 weeks gestation of pregnancy: Secondary | ICD-10-CM | POA: Diagnosis not present

## 2021-01-22 DIAGNOSIS — N049 Nephrotic syndrome with unspecified morphologic changes: Secondary | ICD-10-CM | POA: Diagnosis not present

## 2021-01-22 DIAGNOSIS — Z3A18 18 weeks gestation of pregnancy: Secondary | ICD-10-CM | POA: Diagnosis not present

## 2021-01-22 DIAGNOSIS — O26891 Other specified pregnancy related conditions, first trimester: Secondary | ICD-10-CM | POA: Diagnosis not present

## 2021-01-22 DIAGNOSIS — R600 Localized edema: Secondary | ICD-10-CM | POA: Diagnosis not present

## 2021-01-22 DIAGNOSIS — O99611 Diseases of the digestive system complicating pregnancy, first trimester: Secondary | ICD-10-CM | POA: Diagnosis not present

## 2021-01-22 LAB — CBC
HCT: 34.8 % — ABNORMAL LOW (ref 36.0–46.0)
Hemoglobin: 12.4 g/dL (ref 12.0–15.0)
MCH: 32.1 pg (ref 26.0–34.0)
MCHC: 35.6 g/dL (ref 30.0–36.0)
MCV: 90.2 fL (ref 80.0–100.0)
Platelets: 178 10*3/uL (ref 150–400)
RBC: 3.86 MIL/uL — ABNORMAL LOW (ref 3.87–5.11)
RDW: 12.7 % (ref 11.5–15.5)
WBC: 11.3 10*3/uL — ABNORMAL HIGH (ref 4.0–10.5)
nRBC: 0 % (ref 0.0–0.2)

## 2021-01-22 LAB — BASIC METABOLIC PANEL
Anion gap: 5 (ref 5–15)
BUN: 7 mg/dL (ref 6–20)
CO2: 25 mmol/L (ref 22–32)
Calcium: 8.9 mg/dL (ref 8.9–10.3)
Chloride: 107 mmol/L (ref 98–111)
Creatinine, Ser: 0.64 mg/dL (ref 0.44–1.00)
GFR, Estimated: >60 mL/min (ref >60)
Glucose, Bld: 109 mg/dL — ABNORMAL HIGH (ref 70–99)
Potassium: 3.5 mmol/L (ref 3.5–5.1)
Sodium: 137 mmol/L (ref 135–145)

## 2021-01-22 LAB — URINALYSIS, COMPLETE (UACMP) WITH MICROSCOPIC
Bilirubin Urine: NEGATIVE
Glucose, UA: NEGATIVE mg/dL
Hgb urine dipstick: NEGATIVE
Ketones, ur: NEGATIVE mg/dL
Nitrite: NEGATIVE
Protein, ur: NEGATIVE mg/dL
Specific Gravity, Urine: 1.02 (ref 1.005–1.030)
pH: 5 (ref 5.0–8.0)

## 2021-01-22 LAB — HEPATIC FUNCTION PANEL
ALT: 11 U/L (ref 0–44)
AST: 19 U/L (ref 15–41)
Albumin: 3.3 g/dL — ABNORMAL LOW (ref 3.5–5.0)
Alkaline Phosphatase: 41 U/L (ref 38–126)
Bilirubin, Direct: <0.1 mg/dL (ref 0.0–0.2)
Total Bilirubin: 0.5 mg/dL (ref 0.3–1.2)
Total Protein: 6.9 g/dL (ref 6.5–8.1)

## 2021-01-22 LAB — PREGNANCY, URINE: Preg Test, Ur: POSITIVE — AB

## 2021-01-22 LAB — LIPASE, BLOOD: Lipase: 28 U/L (ref 11–51)

## 2021-01-22 MED ORDER — ONDANSETRON 4 MG PO TBDP: 4.0000 mg | ORAL_TABLET | Freq: Three times a day (TID) | ORAL | 0 refills | Status: DC | PRN

## 2021-01-22 MED ORDER — LACTATED RINGERS IV BOLUS
1000.0000 mL | Freq: Once | INTRAVENOUS | Status: AC
  Administered 2021-01-22: 1000 mL via INTRAVENOUS

## 2021-01-22 MED ORDER — LIDOCAINE 5 % EX PTCH
1.0000 | MEDICATED_PATCH | CUTANEOUS | Status: DC
  Administered 2021-01-22: 1 via TRANSDERMAL
  Filled 2021-01-22: qty 1

## 2021-01-22 MED ORDER — OXYCODONE-ACETAMINOPHEN 5-325 MG PO TABS: 1.0000 | ORAL_TABLET | ORAL | 0 refills | Status: DC | PRN

## 2021-01-22 MED ORDER — MORPHINE SULFATE (PF) 4 MG/ML IV SOLN
4.0000 mg | Freq: Once | INTRAVENOUS | Status: AC
  Administered 2021-01-22: 4 mg via INTRAVENOUS
  Filled 2021-01-22: qty 1

## 2021-01-22 MED ORDER — ONDANSETRON 4 MG PO TBDP: 4.0000 mg | ORAL_TABLET | Freq: Once | ORAL | Status: DC | PRN

## 2021-01-22 MED ORDER — SODIUM CHLORIDE 0.9 % IV SOLN
12.5000 mg | Freq: Once | INTRAVENOUS | Status: AC
  Administered 2021-01-22: 12.5 mg via INTRAVENOUS
  Filled 2021-01-22: qty 12.5

## 2021-01-22 MED ORDER — ONDANSETRON HCL 4 MG/2ML IJ SOLN
4.0000 mg | Freq: Once | INTRAMUSCULAR | Status: AC
  Administered 2021-01-22: 4 mg via INTRAVENOUS
  Filled 2021-01-22: qty 2

## 2021-01-22 MED ORDER — CEPHALEXIN 500 MG PO CAPS: 500.0000 mg | ORAL_CAPSULE | Freq: Two times a day (BID) | ORAL | 0 refills | Status: AC

## 2021-01-22 NOTE — ED Notes (Signed)
Lab called and informed of hepatic lab add-on.  Lab verbalized understanding.

## 2021-01-22 NOTE — ED Provider Notes (Signed)
[facility] Emergency Department Provider Note   ____________________________________________   Event Date/Time   First MD Initiated Contact with Patient 01/22/21 [phone]     (approximate)  I have reviewed the triage vital signs and the nursing notes.   HISTORY  Chief Complaint Flank Pain and Abdominal Pain    HPI Christine Mcbride is a 25 y.o. female, G1, P0 at approximately 10 weeks of pregnancy, presents to the ED complaining of abdominal pain.  Patient reports that overnight she has developed steadily worsening pain in the left lower quadrant of her abdomen as well as in her lower back.  She describes pain now sharp and severe, not exacerbated or alleviated by anything.  It has been associated with nausea and multiple episodes of vomiting, but she denies any changes in her bowel movements and has not had any vaginal bleeding or discharge.  She denies similar symptoms in the past, has not had any complications with her pregnancy thus far.  Pain seems to localize to the middle of her lower back and does not affect either of her flanks.  She denies any dysuria, hematuria, or fever.  She has never had similar pain in the past and denies history of kidney stones.        Past Medical History:  Diagnosis Date   Anemia    Asthma    H/O dysmenorrhea    Hx of migraines    Irregular menstrual cycle     Patient Active Problem List   Diagnosis Date Noted   Rh negative state in antepartum period 01/12/2021   Gastroesophageal reflux in pregnancy 01/12/2021   Hx of migraine headaches 01/12/2021    History reviewed. No pertinent surgical history.  Prior to Admission medications   Medication Sig Start Date End Date Taking? Authorizing Provider  cephALEXin (KEFLEX) 500 MG capsule Take 1 capsule (500 mg total) by mouth 2 (two) times daily for 7 days. 01/22/21 01/29/21 Yes Chesley Noon, MD  ondansetron (ZOFRAN ODT) 4 MG disintegrating tablet Take 1 tablet (4 mg  total) by mouth every 8 (eight) hours as needed for nausea or vomiting. 01/22/21  Yes Chesley Noon, MD  oxyCODONE-acetaminophen (PERCOCET) 5-325 MG tablet Take 1 tablet by mouth every 4 (four) hours as needed for severe pain. 01/22/21 01/22/22 Yes Chesley Noon, MD  Prenatal Vit-Fe Fumarate-FA (PRENATAL MULTIVITAMIN) TABS tablet Take 1 tablet by mouth daily at 12 noon.    [provider]    Allergies Patient has no known allergies.  Family History  Problem Relation Age of Onset   Ovarian cancer Mother    Diabetes Father    Diabetes Maternal Grandmother    Hypertension Maternal Grandfather    Diabetes Paternal Grandmother    Heart attack Paternal Grandmother     Social History Social History   Tobacco Use   Smoking status: Never   Smokeless tobacco: Never  Vaping Use   Vaping Use: Never used  Substance Use Topics   Alcohol use: No   Drug use: No    Review of Systems  Constitutional: No fever/chills Eyes: No visual changes. ENT: No sore throat. Cardiovascular: Denies chest pain. Respiratory: Denies shortness of breath. Gastrointestinal: Positive for abdominal pain, nausea, and vomiting.  No diarrhea.  No constipation. Genitourinary: Negative for dysuria. Musculoskeletal: Positive for back pain. Skin: Negative for rash. Neurological: Negative for headaches, focal weakness or numbness.  ____________________________________________   PHYSICAL EXAM:  VITAL SIGNS: ED Triage Vitals  Enc Vitals Group  BP 01/22/21 0832 114/79     Pulse Rate 01/22/21 0832 90     Resp --      Temp 01/22/21 0832 98.3 F (36.8 C)     Temp Source 01/22/21 0832 Oral     SpO2 01/22/21 0832 98 %     Weight 01/22/21 0835 142 lb (64.4 kg)     Height 01/22/21 0835 5\' 2"  (1.575 m)     Head Circumference --      Peak Flow --      Pain Score 01/22/21 0834 10     Pain Loc --      Pain Edu? --      Excl. in GC? --     Constitutional: Alert and oriented. Eyes: Conjunctivae are  normal. Head: Atraumatic. Nose: No congestion/rhinnorhea. Mouth/Throat: Mucous membranes are moist. Neck: Normal ROM Cardiovascular: Normal rate, regular rhythm. Grossly normal heart sounds. Respiratory: Normal respiratory effort.  No retractions. Lungs CTAB. Gastrointestinal: Gravid abdomen, soft and tender to palpation in the left lower quadrant with no rebound or guarding.  No CVA tenderness bilaterally. No distention. Genitourinary: deferred Musculoskeletal: No lower extremity tenderness nor edema. Neurologic:  Normal speech and language. No gross focal neurologic deficits are appreciated. Skin:  Skin is warm, dry and intact. No rash noted. Psychiatric: Mood and affect are normal. Speech and behavior are normal.  ____________________________________________   LABS (all labs ordered are listed, but only abnormal results are displayed)  Labs Reviewed  URINALYSIS, COMPLETE (UACMP) WITH MICROSCOPIC - Abnormal; Notable for the following components:      Result Value   Color, Urine YELLOW (*)    APPearance CLOUDY (*)    Leukocytes,Ua TRACE (*)    Bacteria, UA RARE (*)    All other components within normal limits  BASIC METABOLIC PANEL - Abnormal; Notable for the following components:   Glucose, Bld 109 (*)    All other components within normal limits  CBC - Abnormal; Notable for the following components:   WBC 11.3 (*)    RBC 3.86 (*)    HCT 34.8 (*)    All other components within normal limits  PREGNANCY, URINE - Abnormal; Notable for the following components:   Preg Test, Ur POSITIVE (*)    All other components within normal limits  HEPATIC FUNCTION PANEL - Abnormal; Notable for the following components:   Albumin 3.3 (*)    All other components within normal limits  URINE CULTURE  LIPASE, BLOOD    PROCEDURES  Procedure(s) performed (including Critical Care):  Procedures   ____________________________________________   INITIAL IMPRESSION / ASSESSMENT AND PLAN /  ED COURSE      25 year old female, G1P0 at approximately 18 weeks of pregnancy, presents to the ED complaining of gradually worsening pain in the left lower quadrant of her abdomen as well as the middle of her lower back that has now become severe this morning pain is reproducible with palpation of her left lower quadrant but she has no CVA tenderness bilaterally.  She denies any vaginal bleeding or urinary symptoms.  We will further assess with labs, UA, obstetric ultrasound and renal ultrasound to rule out urinary obstruction such as kidney stone.  She continues to have severe pain with active nausea and vomiting despite Zofran, we will treat with IV morphine and Phenergan.  ----------------------------------------- 11:14 AM on 01/22/2021 ----------------------------------------- Patient continues to have severe pain, obstetric ultrasound is unremarkable and renal ultrasound shows no evidence of hydronephrosis.  UA appears unlikely to represent infection,  but given lack of alternative explanation for her pain, we will send for culture.  We will redose morphine for ongoing pain, also place Lidoderm patch in her lower back.  Case discussed with Dr. Valentino Saxon of OB/GYN, who agrees with plan to proceed with MRI of her abdomen/pelvis.  ----------------------------------------- 1:53 PM on 01/22/2021 ----------------------------------------- MRI shows mild left-sided pelviectasis and ureteral prominence, consistent with kidney stone however no stone or other obstructive process visualized.  Findings discussed with Dr. Annabell Howells of urology, who states that kidney stones are difficult to pick up on MRI, but agree symptoms seems consistent with kidney stone.  Given borderline UTI in the setting of pregnancy and possible kidney stone, we will send urine for culture and start patient on Keflex.  Dr. Delford Field agrees that patient would be appropriate for outpatient management and he will contact urology office to arrange  follow-up appointment.  We will prescribe patient a short course of pain medication, she was additionally counseled to follow-up with OB/GYN.  She was counseled to return to the ED for new worsening symptoms, patient and mother agree with plan.      ____________________________________________   FINAL CLINICAL IMPRESSION(S) / ED DIAGNOSES  Final diagnoses:  Abdominal pain in pregnancy  Kidney stone     ED Discharge Orders          Ordered    oxyCODONE-acetaminophen (PERCOCET) 5-325 MG tablet  Every 4 hours PRN        01/22/21 1352    ondansetron (ZOFRAN ODT) 4 MG disintegrating tablet  Every 8 hours PRN        01/22/21 1352    cephALEXin (KEFLEX) 500 MG capsule  2 times daily        01/22/21 1352             Note:  This document was prepared using Dragon voice recognition software and may include unintentional dictation errors.    Chesley Noon, MD 01/22/21 [phone]

## 2021-01-22 NOTE — ED Notes (Signed)
US at bedside

## 2021-01-22 NOTE — ED Notes (Signed)
ED Provider at bedside. 

## 2021-01-23 LAB — URINE CULTURE: Culture: <10000 — AB

## 2021-01-28 ENCOUNTER — Other Ambulatory Visit: Payer: Self-pay

## 2021-01-28 ENCOUNTER — Ambulatory Visit
Admission: RE | Admit: 2021-01-28 | Discharge: 2021-01-28 | Disposition: A | Discharge status: Home / Self Care | Payer: BC Managed Care – PPO | Source: Ambulatory Visit | Attending: Obstetrics and Gynecology | Admitting: Obstetrics and Gynecology

## 2021-01-28 DIAGNOSIS — Z3402 Encounter for supervision of normal first pregnancy, second trimester: Secondary | ICD-10-CM | POA: Insufficient documentation

## 2021-01-28 DIAGNOSIS — Z3492 Encounter for supervision of normal pregnancy, unspecified, second trimester: Secondary | ICD-10-CM | POA: Diagnosis not present

## 2021-01-28 DIAGNOSIS — Z3A18 18 weeks gestation of pregnancy: Secondary | ICD-10-CM | POA: Diagnosis not present

## 2021-02-01 ENCOUNTER — Telehealth: Payer: Self-pay | Admitting: Urology

## 2021-02-01 ENCOUNTER — Other Ambulatory Visit: Payer: Self-pay | Admitting: Urology

## 2021-02-01 DIAGNOSIS — N1339 Other hydronephrosis: Secondary | ICD-10-CM

## 2021-02-01 NOTE — Telephone Encounter (Signed)
I s/w pt about scheduling her RUS.  She didn't want to have this done and wants to cancel her appt w/[facility].  She said she passed her stone while she was in hospital.

## 2021-02-03 ENCOUNTER — Ambulatory Visit: Payer: BC Managed Care – PPO | Admitting: Urology

## 2021-02-09 ENCOUNTER — Other Ambulatory Visit: Payer: Self-pay

## 2021-02-09 ENCOUNTER — Encounter: Payer: Self-pay | Admitting: Obstetrics and Gynecology

## 2021-02-09 ENCOUNTER — Ambulatory Visit (INDEPENDENT_AMBULATORY_CARE_PROVIDER_SITE_OTHER): Payer: BC Managed Care – PPO | Admitting: Obstetrics and Gynecology

## 2021-02-09 VITALS — BP 92/63 | HR 84 | Wt 144.7 lb

## 2021-02-09 DIAGNOSIS — R319 Hematuria, unspecified: Secondary | ICD-10-CM

## 2021-02-09 DIAGNOSIS — Z3402 Encounter for supervision of normal first pregnancy, second trimester: Secondary | ICD-10-CM

## 2021-02-09 DIAGNOSIS — Z3A20 20 weeks gestation of pregnancy: Secondary | ICD-10-CM

## 2021-02-09 LAB — POCT URINALYSIS DIPSTICK OB
Bilirubin, UA: NEGATIVE
Glucose, UA: NEGATIVE
Ketones, UA: NEGATIVE
Nitrite, UA: NEGATIVE
Spec Grav, UA: 1.02 (ref 1.010–1.025)
Urobilinogen, UA: 0.2 E.U./dL
pH, UA: 7 (ref 5.0–8.0)

## 2021-02-09 MED ORDER — NITROFURANTOIN MONOHYD MACRO 100 MG PO CAPS: 100.0000 mg | ORAL_CAPSULE | Freq: Two times a day (BID) | ORAL | 0 refills | Status: DC

## 2021-02-09 NOTE — Progress Notes (Signed)
[name]: She is tired from working 2 jobs, no new concerns. Declined flu vaccine.

## 2021-02-09 NOTE — Progress Notes (Signed)
[name]: She is excited about being pregnant.  She has felt the baby move.  Taking vitamins as directed.  Has completed her anatomy ultrasound.  Recently seen in the hospital for suspected kidney stone.  Pain has since resolved.  Patient took antibiotics for 3 days but then stopped them.  Urine today shows large blood -possible UTI.  We will treat with Macrobid for 1 week.  C&S sent.

## 2021-02-12 LAB — URINE CULTURE

## 2021-03-09 NOTE — Patient Instructions (Signed)
 1-Hour Glucose  No dessert the night before No sweet drinks the day of- soda, fruit juice, sweet tea No sweet breakfast- pancakes, donuts May have mostly protein- egg, bacon, wheat toast, black coffee.               Grilled chicken, salad, vegetable, water.       3-Hour Glucose Test  Must be fasting.  Nothing to eat or drink after midnight.  May have morning medication with a sip of water.     Common Medications Safe in Pregnancy  Acne:      Constipation:  Benzoyl Peroxide     Colace  Clindamycin      Dulcolax Suppository  Topica Erythromycin     Fibercon  Salicylic Acid      Metamucil         Miralax AVOID:        Senakot   Accutane    Cough:  Retin-A       Cough Drops  Tetracycline      Phenergan w/ Codeine if Rx  Minocycline      Robitussin (Plain & DM)  Antibiotics:     Crabs/Lice:  Ceclor       RID  Cephalosporins    AVOID:  E-Mycins      Kwell  Keflex  Macrobid/Macrodantin   Diarrhea:  Penicillin      Kao-Pectate  Zithromax      Imodium AD         PUSH FLUIDS AVOID:       Cipro     Fever:  Tetracycline      Tylenol (Regular or Extra  Minocycline       Strength)  Levaquin      Extra Strength-Do not          Exceed 8 tabs/24 hrs Caffeine:        <200mg/day (equiv. To 1 cup of coffee or  approx. 3 12 oz sodas)         Gas: Cold/Hayfever:       Gas-X  Benadryl      Mylicon  Claritin       Phazyme  **Claritin-D        Chlor-Trimeton    Headaches:  Dimetapp      ASA-Free Excedrin  Drixoral-Non-Drowsy     Cold Compress  Mucinex (Guaifenasin)     Tylenol (Regular or Extra  Sudafed/Sudafed-12 Hour     Strength)  **Sudafed PE Pseudoephedrine   Tylenol Cold & Sinus     Vicks Vapor Rub  Zyrtec  **AVOID if Problems With Blood Pressure         Heartburn: Avoid lying down for at least 1 hour after meals  Aciphex      Maalox     Rash:  Milk of Magnesia     Benadryl    Mylanta       1% Hydrocortisone Cream  Pepcid  Pepcid Complete   Sleep  Aids:  Prevacid      Ambien   Prilosec       Benadryl  Rolaids       Chamomile Tea  Tums (Limit 4/day)     Unisom         Tylenol PM         Warm milk-add vanilla or  Hemorrhoids:       Sugar for taste  Anusol/Anusol H.C.  (RX: Analapram 2.5%)  Sugar Substitutes:  Hydrocortisone OTC     Ok in moderation    Preparation H      Tucks        Vaseline lotion applied to tissue with wiping    Herpes:     Throat:  Acyclovir      Oragel  Famvir  Valtrex     Vaccines:         Flu Shot Leg Cramps:       *Gardasil  Benadryl      Hepatitis A         Hepatitis B Nasal Spray:       Pneumovax  Saline Nasal Spray     Polio Booster         Tetanus Nausea:       Tuberculosis test or PPD  Vitamin B6 25 mg TID   AVOID:    Dramamine      *Gardasil  Emetrol       Live Poliovirus  Ginger Root 250 mg QID    MMR (measles, mumps &  High Complex Carbs @ Bedtime    rebella)  Sea Bands-Accupressure    Varicella (Chickenpox)  Unisom 1/2 tab TID     *No known complications           If received before Pain:         Known pregnancy;   Darvocet       Resume series after  Lortab        Delivery  Percocet    Yeast:   Tramadol      Femstat  Tylenol 3      Gyne-lotrimin  Ultram       Monistat  Vicodin           MISC:         All Sunscreens           Hair Coloring/highlights          Insect Repellant's          (Including DEET)         Mystic Tans  

## 2021-03-10 ENCOUNTER — Other Ambulatory Visit: Payer: Self-pay

## 2021-03-10 ENCOUNTER — Ambulatory Visit (INDEPENDENT_AMBULATORY_CARE_PROVIDER_SITE_OTHER): Payer: BC Managed Care – PPO | Admitting: Obstetrics and Gynecology

## 2021-03-10 ENCOUNTER — Encounter: Payer: Self-pay | Admitting: Obstetrics and Gynecology

## 2021-03-10 VITALS — BP 114/59 | HR 90 | Wt 153.7 lb

## 2021-03-10 DIAGNOSIS — Z3A24 24 weeks gestation of pregnancy: Secondary | ICD-10-CM

## 2021-03-10 DIAGNOSIS — Z3402 Encounter for supervision of normal first pregnancy, second trimester: Secondary | ICD-10-CM

## 2021-03-10 LAB — POCT URINALYSIS DIPSTICK OB
Bilirubin, UA: NEGATIVE
Blood, UA: NEGATIVE
Glucose, UA: NEGATIVE
Ketones, UA: NEGATIVE
Leukocytes, UA: NEGATIVE
Nitrite, UA: NEGATIVE
POC,PROTEIN,UA: NEGATIVE
Spec Grav, UA: 1.01 (ref 1.010–1.025)
Urobilinogen, UA: 0.2 E.U./dL
pH, UA: 7.5 (ref 5.0–8.0)

## 2021-03-10 NOTE — Progress Notes (Signed)
OB-Pt present for routine prenatal care. Pt stated that she was doing well. Pt appeared to upset due to be informed at her last visit by the nurse that she would be getting her one hour glucose at this visit.

## 2021-03-10 NOTE — Progress Notes (Signed)
[name]: Doing well, no complaints. Was prepared to have glucola today, but is due for next visit. RTC in 4 weeks.

## 2021-04-06 NOTE — Patient Instructions (Signed)
Rh0 [D] Immune Globulin injection What is this medication? RhO [D] IMMUNE GLOBULIN (i MYOON  GLOB yoo lin) is used to treat idiopathic thrombocytopenic purpura (ITP). This medicine is used in RhO negative mothers who are pregnant with a RhO positive child. It is also used after a transfusion of RhO positive blood into a RhO negative person. This medicine may be used for other purposes; ask your health care provider or pharmacist if you have questions. COMMON BRAND NAME(S): BayRho-D, HyperRHO S/D, MICRhoGAM, RhoGAM, Rhophylac, WinRho SDF What should I tell my care team before I take this medication? They need to know if you have any of these conditions: bleeding disorders low levels of immunoglobulin A in the body no spleen an unusual or allergic reaction to human immune globulin, other medicines, foods, dyes, or preservatives pregnant or trying to get pregnant breast-feeding How should I use this medication? This medicine is for injection into a muscle or into a vein. It is given by a health care professional in a hospital or clinic setting. Talk to your pediatrician regarding the use of this medicine in children. This medicine is not approved for use in children. Overdosage: If you think you have taken too much of this medicine contact a poison control center or emergency room at once. NOTE: This medicine is only for you. Do not share this medicine with others. What if I miss a dose? It is important not to miss your dose. Call your doctor or health care professional if you are unable to keep an appointment. What may interact with this medication? live virus vaccines, like measles, mumps, or rubella This list may not describe all possible interactions. Give your health care provider a list of all the medicines, herbs, non-prescription drugs, or dietary supplements you use. Also tell them if you smoke, drink alcohol, or use illegal drugs. Some items may interact with your medicine. What should  I watch for while using this medication? This medicine is made from human blood. It may be possible to pass an infection in this medicine. Talk to your doctor about the risks and benefits of this medicine. This medicine may interfere with live virus vaccines. Before you get live virus vaccines tell your health care professional if you have received this medicine within the past 3 months. What side effects may I notice from receiving this medication? Side effects that you should report to your doctor or health care professional as soon as possible: allergic reactions like skin rash, itching or hives, swelling of the face, lips, or tongue breathing problems chest pain or tightness yellowing of the eyes or skin Side effects that usually do not require medical attention (report to your doctor or health care professional if they continue or are bothersome): fever pain and tenderness at site where injected This list may not describe all possible side effects. Call your doctor for medical advice about side effects. You may report side effects to FDA at 1-800-FDA-1088. Where should I keep my medication? This drug is given in a hospital or clinic and will not be stored at home. NOTE: This sheet is a summary. It may not cover all possible information. If you have questions about this medicine, talk to your doctor, pharmacist, or health care provider.  2022 Elsevier/Gold Standard (2008-02-23 00:00:00) Tdap (Tetanus, Diphtheria, Pertussis) Vaccine: What You Need to Know 1. Why get vaccinated? Tdap vaccine can prevent tetanus, diphtheria, and pertussis. Diphtheria and pertussis spread from person to person. Tetanus enters the body through cuts or  wounds. TETANUS (T) causes painful stiffening of the muscles. Tetanus can lead to serious health problems, including being unable to open the mouth, having trouble swallowing and breathing, or death. DIPHTHERIA (D) can lead to difficulty breathing, heart failure,  paralysis, or death. PERTUSSIS (aP), also known as "whooping cough," can cause uncontrollable, violent coughing that makes it hard to breathe, eat, or drink. Pertussis can be extremely serious especially in babies and young children, causing pneumonia, convulsions, brain damage, or death. In teens and adults, it can cause weight loss, loss of bladder control, passing out, and rib fractures from severe coughing. 2. Tdap vaccine Tdap is only for children 7 years and older, adolescents, and adults.  Adolescents should receive a single dose of Tdap, preferably at age 49 or 32 years. Pregnant people should get a dose of Tdap during every pregnancy, preferably during the early part of the third trimester, to help protect the newborn from pertussis. Infants are most at risk for severe, life-threatening complications from pertussis. Adults who have never received Tdap should get a dose of Tdap. Also, adults should receive a booster dose of either Tdap or Td (a different vaccine that protects against tetanus and diphtheria but not pertussis) every 10 years, or after 5 years in the case of a severe or dirty wound or burn. Tdap may be given at the same time as other vaccines. 3. Talk with your health care provider Tell your vaccine provider if the person getting the vaccine: Has had an allergic reaction after a previous dose of any vaccine that protects against tetanus, diphtheria, or pertussis, or has any severe, life-threatening allergies Has had a coma, decreased level of consciousness, or prolonged seizures within 7 days after a previous dose of any pertussis vaccine (DTP, DTaP, or Tdap) Has seizures or another nervous system problem Has ever had Guillain-Barr Syndrome (also called "GBS") Has had severe pain or swelling after a previous dose of any vaccine that protects against tetanus or diphtheria In some cases, your health care provider may decide to postpone Tdap vaccination until a future  visit. People with minor illnesses, such as a cold, may be vaccinated. People who are moderately or severely ill should usually wait until they recover before getting Tdap vaccine.  Your health care provider can give you more information. 4. Risks of a vaccine reaction Pain, redness, or swelling where the shot was given, mild fever, headache, feeling tired, and nausea, vomiting, diarrhea, or stomachache sometimes happen after Tdap vaccination. People sometimes faint after medical procedures, including vaccination. Tell your provider if you feel dizzy or have vision changes or ringing in the ears.  As with any medicine, there is a very remote chance of a vaccine causing a severe allergic reaction, other serious injury, or death. 5. What if there is a serious problem? An allergic reaction could occur after the vaccinated person leaves the clinic. If you see signs of a severe allergic reaction (hives, swelling of the face and throat, difficulty breathing, a fast heartbeat, dizziness, or weakness), call 9-1-1 and get the person to the nearest hospital. For other signs that concern you, call your health care provider.  Adverse reactions should be reported to the Vaccine Adverse Event Reporting System (VAERS). Your health care provider will usually file this report, or you can do it yourself. Visit the VAERS website at www.vaers.SamedayNews.es or call 8124249114. VAERS is only for reporting reactions, and VAERS staff members do not give medical advice. 6. The National Vaccine Injury Fiserv The  National Sport and exercise psychologist (VICP) is a Technical brewer that was created to compensate people who may have been injured by certain vaccines. Claims regarding alleged injury or death due to vaccination have a time limit for filing, which may be as short as two years. Visit the VICP website at GoldCloset.com.ee or call (984) 423-6329 to learn about the program and about filing a  claim. 7. How can I learn more? Ask your health care provider. Call your local or state health department. Visit the website of the Food and Drug Administration (FDA) for vaccine package inserts and additional information at TraderRating.uy. Contact the Centers for Disease Control and Prevention (CDC): Call 680-428-0048 (1-800-CDC-INFO) or Visit CDC's website at http://hunter.com/. Vaccine Information Statement Tdap (Tetanus, Diphtheria, Pertussis) Vaccine (12/27/2019) This information is not intended to replace advice given to you by your health care provider. Make sure you discuss any questions you have with your health care provider. Document Revised: 01/22/2020 Document Reviewed: 01/22/2020 Elsevier Patient Education  [place]. Common Medications Safe in Pregnancy  Acne:      Constipation:  Benzoyl Peroxide     Colace  Clindamycin      Dulcolax Suppository  Topica Erythromycin     Fibercon  Salicylic Acid      Metamucil         Miralax AVOID:        Senakot   Accutane    Cough:  Retin-A       Cough Drops  Tetracycline      Phenergan w/ Codeine if Rx  Minocycline      Robitussin (Plain & DM)  Antibiotics:     Crabs/Lice:  Ceclor       RID  Cephalosporins    AVOID:  E-Mycins      Kwell  Keflex  Macrobid/Macrodantin   Diarrhea:  Penicillin      Kao-Pectate  Zithromax      Imodium AD         PUSH FLUIDS AVOID:       Cipro     Fever:  Tetracycline      Tylenol (Regular or Extra  Minocycline       Strength)  Levaquin      Extra Strength-Do not          Exceed 8 tabs/24 hrs Caffeine:        <252m/day (equiv. To 1 cup of coffee or  approx. 3 12 oz sodas)         Gas: Cold/Hayfever:       Gas-X  Benadryl      Mylicon  Claritin       Phazyme  **Claritin-D        Chlor-Trimeton    Headaches:  Dimetapp      ASA-Free Excedrin  Drixoral-Non-Drowsy     Cold Compress  Mucinex (Guaifenasin)     Tylenol (Regular or  Extra  Sudafed/Sudafed-12 Hour     Strength)  **Sudafed PE Pseudoephedrine   Tylenol Cold & Sinus     Vicks Vapor Rub  Zyrtec  **AVOID if Problems With Blood Pressure         Heartburn: Avoid lying down for at least 1 hour after meals  Aciphex      Maalox     Rash:  Milk of Magnesia     Benadryl    Mylanta       1% Hydrocortisone Cream  Pepcid  Pepcid Complete   Sleep Aids:  Prevacid  Ambien   Prilosec       Benadryl  Rolaids       Chamomile Tea  Tums (Limit 4/day)     Unisom         Tylenol PM         Warm milk-add vanilla or  Hemorrhoids:       Sugar for taste  Anusol/Anusol H.C.  (RX: Analapram 2.5%)  Sugar Substitutes:  Hydrocortisone OTC     Ok in moderation  Preparation H      Tucks        Vaseline lotion applied to tissue with wiping    Herpes:     Throat:  Acyclovir      Oragel  Famvir  Valtrex     Vaccines:         Flu Shot Leg Cramps:       *Gardasil  Benadryl      Hepatitis A         Hepatitis B Nasal Spray:       Pneumovax  Saline Nasal Spray     Polio Booster         Tetanus Nausea:       Tuberculosis test or PPD  Vitamin B6 25 mg TID   AVOID:    Dramamine      *Gardasil  Emetrol       Live Poliovirus  Ginger Root 250 mg QID    MMR (measles, mumps &  High Complex Carbs @ Bedtime    rebella)  Sea Bands-Accupressure    Varicella (Chickenpox)  Unisom 1/2 tab TID     *No known complications           If received before Pain:         Known pregnancy;   Darvocet       Resume series after  Lortab        Delivery  Percocet    Yeast:   Tramadol      Femstat  Tylenol 3      Gyne-lotrimin  Ultram       Monistat  Vicodin           MISC:         All Sunscreens           Hair Coloring/highlights          Insect Repellant's          (Including DEET)         Mystic Tans

## 2021-04-07 ENCOUNTER — Encounter: Payer: Self-pay | Admitting: Obstetrics and Gynecology

## 2021-04-07 ENCOUNTER — Ambulatory Visit (INDEPENDENT_AMBULATORY_CARE_PROVIDER_SITE_OTHER): Payer: BC Managed Care – PPO | Admitting: Obstetrics and Gynecology

## 2021-04-07 ENCOUNTER — Other Ambulatory Visit: Payer: Self-pay

## 2021-04-07 ENCOUNTER — Other Ambulatory Visit: Payer: BC Managed Care – PPO

## 2021-04-07 VITALS — BP 111/71 | HR 104 | Wt 159.7 lb

## 2021-04-07 DIAGNOSIS — Z3403 Encounter for supervision of normal first pregnancy, third trimester: Secondary | ICD-10-CM | POA: Diagnosis not present

## 2021-04-07 DIAGNOSIS — Z23 Encounter for immunization: Secondary | ICD-10-CM

## 2021-04-07 DIAGNOSIS — Z3402 Encounter for supervision of normal first pregnancy, second trimester: Secondary | ICD-10-CM | POA: Diagnosis not present

## 2021-04-07 DIAGNOSIS — Z3A28 28 weeks gestation of pregnancy: Secondary | ICD-10-CM | POA: Diagnosis not present

## 2021-04-07 LAB — POCT URINALYSIS DIPSTICK OB
Bilirubin, UA: NEGATIVE
Blood, UA: NEGATIVE
Glucose, UA: NEGATIVE
Ketones, UA: NEGATIVE
Leukocytes, UA: NEGATIVE
Nitrite, UA: NEGATIVE
POC,PROTEIN,UA: NEGATIVE
Spec Grav, UA: 1.01 (ref 1.010–1.025)
Urobilinogen, UA: 0.2 E.U./dL
pH, UA: 7.5 (ref 5.0–8.0)

## 2021-04-07 MED ORDER — RHO D IMMUNE GLOBULIN 1500 UNIT/2ML IJ SOSY
300.0000 ug | PREFILLED_SYRINGE | Freq: Once | INTRAMUSCULAR | Status: AC
  Administered 2021-04-07: 300 ug via INTRAMUSCULAR

## 2021-04-07 MED ORDER — TETANUS-DIPHTH-ACELL PERTUSSIS 5-2.5-18.5 LF-MCG/0.5 IM SUSY
0.5000 mL | PREFILLED_SYRINGE | Freq: Once | INTRAMUSCULAR | Status: AC
  Administered 2021-04-07: 0.5 mL via INTRAMUSCULAR

## 2021-04-07 NOTE — Progress Notes (Signed)
[name]: No complaints today.  Reports very active baby.  Taking vitamins as directed.  1 hour GCT today.  Does occasionally have leg cramps and we have discussed management strategies.

## 2021-04-07 NOTE — Progress Notes (Signed)
OB-Pt present for routine prenatal care. BTC, 1 hr GTC, tdap and Rhogam completed.

## 2021-04-08 LAB — GLUCOSE, 1 HOUR GESTATIONAL: Gestational Diabetes Screen: 139 mg/dL (ref 70–139)

## 2021-04-08 LAB — CBC
Hematocrit: 33 % — ABNORMAL LOW (ref 34.0–46.6)
Hemoglobin: 11 g/dL — ABNORMAL LOW (ref 11.1–15.9)
MCH: 30.2 pg (ref 26.6–33.0)
MCHC: 33.3 g/dL (ref 31.5–35.7)
MCV: 91 fL (ref 79–97)
Platelets: 186 10*3/uL (ref 150–450)
RBC: 3.64 x10E6/uL — ABNORMAL LOW (ref 3.77–5.28)
RDW: 12.1 % (ref 11.7–15.4)
WBC: 12.7 10*3/uL — ABNORMAL HIGH (ref 3.4–10.8)

## 2021-04-08 LAB — RPR: RPR Ser Ql: NONREACTIVE

## 2021-04-11 ENCOUNTER — Encounter: Payer: Self-pay | Admitting: Obstetrics and Gynecology

## 2021-04-28 ENCOUNTER — Ambulatory Visit (INDEPENDENT_AMBULATORY_CARE_PROVIDER_SITE_OTHER): Payer: BC Managed Care – PPO | Admitting: Obstetrics and Gynecology

## 2021-04-28 ENCOUNTER — Encounter: Payer: Self-pay | Admitting: Obstetrics and Gynecology

## 2021-04-28 ENCOUNTER — Other Ambulatory Visit: Payer: Self-pay

## 2021-04-28 VITALS — BP 121/73 | HR 87 | Wt 165.9 lb

## 2021-04-28 DIAGNOSIS — Z3403 Encounter for supervision of normal first pregnancy, third trimester: Secondary | ICD-10-CM

## 2021-04-28 DIAGNOSIS — Z3A31 31 weeks gestation of pregnancy: Secondary | ICD-10-CM

## 2021-04-28 LAB — POCT URINALYSIS DIPSTICK OB
Bilirubin, UA: NEGATIVE
Blood, UA: NEGATIVE
Glucose, UA: NEGATIVE
Ketones, UA: NEGATIVE
Leukocytes, UA: NEGATIVE
Nitrite, UA: NEGATIVE
POC,PROTEIN,UA: NEGATIVE
Spec Grav, UA: 1.01 (ref 1.010–1.025)
Urobilinogen, UA: 0.2 E.U./dL
pH, UA: 7 (ref 5.0–8.0)

## 2021-04-28 NOTE — Progress Notes (Signed)
[name]: Patient notes some pelvic pain with walking, likely round ligament pain. Advised on belly band and Tylenol. Plans to breastfeed, desires Combination OCPs for contraception. Further discussed patient's glucola, was borderline(139).  Reported 1 day of increased thirst and feeling nauseated after eating ice cream.  Advised on following carb-modified diet to prevent onset of gestational DM. Will monitor urine for glucosuria at each visit. RTC in 2 weeks.

## 2021-04-28 NOTE — Progress Notes (Signed)
[name]. Patient states she is experiencing pelvic pain. Patients states no other concerns at this time.

## 2021-04-28 NOTE — Patient Instructions (Signed)

## 2021-05-12 ENCOUNTER — Other Ambulatory Visit: Payer: Self-pay

## 2021-05-12 ENCOUNTER — Encounter: Payer: Self-pay | Admitting: Obstetrics and Gynecology

## 2021-05-12 ENCOUNTER — Ambulatory Visit (INDEPENDENT_AMBULATORY_CARE_PROVIDER_SITE_OTHER): Payer: BC Managed Care – PPO | Admitting: Obstetrics and Gynecology

## 2021-05-12 VITALS — BP 110/71 | HR 96 | Wt 168.4 lb

## 2021-05-12 DIAGNOSIS — Z3403 Encounter for supervision of normal first pregnancy, third trimester: Secondary | ICD-10-CM

## 2021-05-12 DIAGNOSIS — Z3A33 33 weeks gestation of pregnancy: Secondary | ICD-10-CM

## 2021-05-12 LAB — POCT URINALYSIS DIPSTICK OB
Bilirubin, UA: NEGATIVE
Blood, UA: NEGATIVE
Glucose, UA: NEGATIVE
Ketones, UA: NEGATIVE
Leukocytes, UA: NEGATIVE
Nitrite, UA: NEGATIVE
POC,PROTEIN,UA: NEGATIVE
Spec Grav, UA: 1.015 (ref 1.010–1.025)
Urobilinogen, UA: 0.2 E.U./dL
pH, UA: 6.5 (ref 5.0–8.0)

## 2021-05-12 NOTE — Progress Notes (Signed)
[name]. Patient states no complaints other than pelvic pressure, which is relieved with use of her belly band. Patient reports fetal movement as well.

## 2021-05-12 NOTE — Progress Notes (Signed)
[name]: Patient doing better.  Belly band is helping.  Signs and symptoms of labor discussed.  Postdates induction discussed.  Epidural discussed.  Breast-feeding discussed.

## 2021-05-23 NOTE — L&D Delivery Note (Signed)
Delivery Summary for Christine Mcbride  Labor Events:   Preterm labor: No data found  Rupture date: 06/29/2021  Rupture time: 8:10 AM  Rupture type: Artificial Bulging bag of water  Fluid Color: Moderate Meconium  Induction: No data found  Augmentation: No data found  Complications: No data found  Cervical ripening: No data found No data found   No data found     Delivery:   Episiotomy: No data found  Lacerations: No data found  Repair suture: No data found  Repair # of packets: No data found  Blood loss (ml): 175   Information for the patient's newborn:  Jeffrey, Voth Vasti [[id number]]   Delivery 06/29/2021 3:54 PM by  Vaginal, Spontaneous Sex:  female Gestational Age: [redacted]w[redacted]d Delivery Clinician:   Living?:         APGARS  One minute Five minutes Ten minutes  Skin color:        Heart rate:        Grimace:        Muscle tone:        Breathing:        Totals: 8  9      Presentation/position:      Resuscitation:   Cord information:    Disposition of cord blood:     Blood gases sent?  Complications:   Placenta: Delivered:       appearance Newborn Measurements: Weight: 7 lb 9.7 oz (3450 g)  Height: 20.28"  Head circumference:    Chest circumference:    Other providers:    Additional  information: Forceps:   Vacuum:   Breech:   Observed anomalies       Delivery Note At 3:54 PM a viable and healthy female was delivered via Vaginal, Spontaneous (Presentation: Vertex; 3450 grams).  APGAR: 8, 9; weight 7 lb 9.7 oz (3450 g).   Placenta status: Spontaneous, Intact.  Cord: 3 vessels with the following complications: None.  Cord pH: not obtained.  Delayed cord clamping observed.   Anesthesia: Epidural Episiotomy: None Lacerations: None Suture Repair:  None Est. Blood Loss (mL): 175  Mom to postpartum.  Baby to Couplet care / Skin to Skin.  Hildred Laser, MD [facility] 07/02/2021, 9:31 PM

## 2021-05-26 ENCOUNTER — Other Ambulatory Visit: Payer: Self-pay

## 2021-05-26 ENCOUNTER — Encounter: Payer: Self-pay | Admitting: Obstetrics and Gynecology

## 2021-05-26 ENCOUNTER — Ambulatory Visit (INDEPENDENT_AMBULATORY_CARE_PROVIDER_SITE_OTHER): Payer: BC Managed Care – PPO | Admitting: Obstetrics and Gynecology

## 2021-05-26 VITALS — BP 103/64 | HR 96 | Wt 168.0 lb

## 2021-05-26 DIAGNOSIS — E669 Obesity, unspecified: Secondary | ICD-10-CM

## 2021-05-26 DIAGNOSIS — Z3403 Encounter for supervision of normal first pregnancy, third trimester: Secondary | ICD-10-CM

## 2021-05-26 DIAGNOSIS — Z3A35 35 weeks gestation of pregnancy: Secondary | ICD-10-CM

## 2021-05-26 DIAGNOSIS — E66811 Obesity, class 1: Secondary | ICD-10-CM | POA: Insufficient documentation

## 2021-05-26 LAB — POCT URINALYSIS DIPSTICK OB
Bilirubin, UA: NEGATIVE
Blood, UA: NEGATIVE
Glucose, UA: NEGATIVE
Ketones, UA: NEGATIVE
Leukocytes, UA: NEGATIVE
Nitrite, UA: NEGATIVE
POC,PROTEIN,UA: NEGATIVE
Spec Grav, UA: 1.02 (ref 1.010–1.025)
Urobilinogen, UA: NEGATIVE E.U./dL — AB
pH, UA: 7 (ref 5.0–8.0)

## 2021-05-26 NOTE — Progress Notes (Signed)
[name]: Patient doing well. No issues.  RTC in 1 week, for 36 weeks cultures at that visit.

## 2021-05-28 ENCOUNTER — Telehealth: Payer: Self-pay

## 2021-05-28 NOTE — Telephone Encounter (Signed)
Patient called in stating that she is leaking and wanted to know if it was okay to start collecting colostrum.

## 2021-06-01 ENCOUNTER — Encounter: Payer: Self-pay | Admitting: Obstetrics and Gynecology

## 2021-06-01 ENCOUNTER — Ambulatory Visit (INDEPENDENT_AMBULATORY_CARE_PROVIDER_SITE_OTHER): Payer: BC Managed Care – PPO | Admitting: Obstetrics and Gynecology

## 2021-06-01 ENCOUNTER — Other Ambulatory Visit: Payer: Self-pay

## 2021-06-01 VITALS — BP 114/54 | HR 99 | Wt 173.4 lb

## 2021-06-01 DIAGNOSIS — Z3403 Encounter for supervision of normal first pregnancy, third trimester: Secondary | ICD-10-CM

## 2021-06-01 DIAGNOSIS — Z3A36 36 weeks gestation of pregnancy: Secondary | ICD-10-CM | POA: Diagnosis not present

## 2021-06-01 LAB — POCT URINALYSIS DIPSTICK OB
Bilirubin, UA: NEGATIVE
Glucose, UA: NEGATIVE
Ketones, UA: NEGATIVE
Leukocytes, UA: NEGATIVE
Nitrite, UA: NEGATIVE
POC,PROTEIN,UA: NEGATIVE
Spec Grav, UA: 1.015 (ref 1.010–1.025)
Urobilinogen, UA: 0.2 E.U./dL
pH, UA: 6.5 (ref 5.0–8.0)

## 2021-06-01 NOTE — Progress Notes (Signed)
[name]: No issues.  She feels well.  Denies contractions.  Cultures performed today.

## 2021-06-01 NOTE — Progress Notes (Signed)
[name]: She is doing well feels tired. She has no new concerns.

## 2021-06-03 LAB — STREP GP B NAA: Strep Gp B NAA: NEGATIVE

## 2021-06-04 LAB — GC/CHLAMYDIA PROBE AMP
Chlamydia trachomatis, NAA: NEGATIVE
Neisseria Gonorrhoeae by PCR: NEGATIVE

## 2021-06-07 ENCOUNTER — Encounter: Payer: Self-pay | Admitting: Obstetrics and Gynecology

## 2021-06-08 ENCOUNTER — Encounter: Payer: Self-pay | Admitting: Obstetrics and Gynecology

## 2021-06-08 ENCOUNTER — Ambulatory Visit (INDEPENDENT_AMBULATORY_CARE_PROVIDER_SITE_OTHER): Payer: BC Managed Care – PPO | Admitting: Obstetrics and Gynecology

## 2021-06-08 ENCOUNTER — Other Ambulatory Visit: Payer: Self-pay

## 2021-06-08 VITALS — BP 108/70 | HR 111 | Wt 170.0 lb

## 2021-06-08 DIAGNOSIS — Z3403 Encounter for supervision of normal first pregnancy, third trimester: Secondary | ICD-10-CM

## 2021-06-08 DIAGNOSIS — L731 Pseudofolliculitis barbae: Secondary | ICD-10-CM

## 2021-06-08 LAB — POCT URINALYSIS DIPSTICK OB
Bilirubin, UA: NEGATIVE
Glucose, UA: NEGATIVE
Ketones, UA: NEGATIVE
Leukocytes, UA: NEGATIVE
Nitrite, UA: NEGATIVE
POC,PROTEIN,UA: NEGATIVE
Spec Grav, UA: 1.015 (ref 1.010–1.025)
Urobilinogen, UA: 0.2 E.U./dL
pH, UA: 6.5 (ref 5.0–8.0)

## 2021-06-08 NOTE — Progress Notes (Signed)
[name]: Patient noted unbearable pain on left side yesterday morning, woke her from her sleep, then noted pelvic pressure. Lasted for several hours but finally resolved. Also c/o bump on labia, exam notes ingrown hair. but 36 week cultures negative.

## 2021-06-15 ENCOUNTER — Encounter: Payer: Self-pay | Admitting: Obstetrics and Gynecology

## 2021-06-15 ENCOUNTER — Other Ambulatory Visit: Payer: Self-pay

## 2021-06-15 ENCOUNTER — Ambulatory Visit (INDEPENDENT_AMBULATORY_CARE_PROVIDER_SITE_OTHER): Payer: BC Managed Care – PPO | Admitting: Obstetrics and Gynecology

## 2021-06-15 VITALS — BP 118/48 | HR 93 | Wt 175.4 lb

## 2021-06-15 DIAGNOSIS — Z3A38 38 weeks gestation of pregnancy: Secondary | ICD-10-CM

## 2021-06-15 DIAGNOSIS — Z3403 Encounter for supervision of normal first pregnancy, third trimester: Secondary | ICD-10-CM

## 2021-06-15 LAB — POCT URINALYSIS DIPSTICK OB
Bilirubin, UA: NEGATIVE
Glucose, UA: NEGATIVE
Ketones, UA: NEGATIVE
Leukocytes, UA: NEGATIVE
Nitrite, UA: NEGATIVE
POC,PROTEIN,UA: NEGATIVE
Spec Grav, UA: 1.01 (ref 1.010–1.025)
Urobilinogen, UA: 0.2 E.U./dL
pH, UA: 7.5 (ref 5.0–8.0)

## 2021-06-15 NOTE — Progress Notes (Signed)
[name]: Patient has some contractions yesterday but they resolved on their own.  She reports daily fetal movement.  Signs and symptoms of labor reviewed.  Cervix unchanged from last week.

## 2021-06-15 NOTE — Progress Notes (Signed)
[name]: She is doing well. She had some pain last night from 7 til 9, she started to go to L&D, but after she showered and ate, pain resolved.

## 2021-06-22 ENCOUNTER — Other Ambulatory Visit: Payer: Self-pay

## 2021-06-22 ENCOUNTER — Ambulatory Visit (INDEPENDENT_AMBULATORY_CARE_PROVIDER_SITE_OTHER): Payer: BC Managed Care – PPO | Admitting: Obstetrics and Gynecology

## 2021-06-22 ENCOUNTER — Encounter: Payer: Self-pay | Admitting: Obstetrics and Gynecology

## 2021-06-22 VITALS — BP 108/66 | HR 98 | Wt 174.0 lb

## 2021-06-22 DIAGNOSIS — Z3A39 39 weeks gestation of pregnancy: Secondary | ICD-10-CM

## 2021-06-22 DIAGNOSIS — Z3403 Encounter for supervision of normal first pregnancy, third trimester: Secondary | ICD-10-CM

## 2021-06-22 NOTE — Patient Instructions (Addendum)
Natural Cervical Ripening Supplements    Evening Primrose Oil (1000 mg) Take twice daily - by mouth in the morning and vaginally at bedtime. (If previous C-section, take both doses by mouth) Start at 85 weeks and continue until labor    Blue Cohosh 1 capsule daily starting at [redacted] weeks gestation 2 capsules daily starting at [redacted] weeks gestation 3 capsules daily starting at [redacted] weeks gestation       COVID 19 Instructions for Scheduled Procedure (Inductions/C-sections and GYN surgeries)   Thank you for choosing [facility] for your services.  You have been scheduled for a procedure called ________Induction of Labor______________.    Your procedure is scheduled on ___________February 9th at midnight______________.  You are required to have COVID-19 testing performed 2 days prior to your scheduled procedure date.  Testing is performed between 8 AM and 12 PM Monday through Friday.  Please present for testing on ___ February 7th__________________ during this hour. Testing is performed in front of the [place] in the Pre-Admission Testing area on the second floor. Please call [phone] to schedule your COVID testing.     Upon your scheduled procedure date, you will need to arrive at the [place] entrance. (There is a statue at the front of this entrance.)   Please arrive on time if you are scheduled for an induction of labor.   If you are scheduled for a Cesarean delivery or for Gyn Surgery, arrive 2 hours prior to your procedure time.  If you are an Obstetric patient and your arrival time falls between 11 PM and 6 AM call L&D [phone]) when you arrive.  A staff member will meet you at the [place] entrance.  At this time, patients are allowed 2 support persons to accompany them. Face masks are required for you and your support person(s). Your support person(s) are now allowed to be there with you during the entire time of your admission.   Please  contact the office if you have any questions regarding this information.  The [facility] office number is [phone].     Thank you,    Your [facility] Providers      Labor Induction Labor induction is when steps are taken to cause a pregnant woman to begin the labor process. Most women go into labor on their own between 37 weeks and 42 weeks of pregnancy. When this does not happen, or when there is a medical need for labor to begin, steps may be taken to induce, or bring on, labor. Labor induction causes a pregnant woman's uterus to contract. It also causes the cervix to soften (ripen), open (dilate), and thin out. Usually, labor is not induced before 39 weeks of pregnancy unless there is a medical reason to do so. When is labor induction considered? Labor induction may be right for you if: Your pregnancy lasts longer than 41 to 42 weeks. Your placenta is separating from your uterus (placental abruption). You have a rupture of membranes and your labor does not begin. You have health problems, like diabetes or high blood pressure (preeclampsia) during your pregnancy. Your baby has stopped growing or does not have enough amniotic fluid. Before labor induction begins, your health care provider will consider the following factors: Your medical condition and the baby's condition. How many weeks you have been pregnant. How mature the baby's lungs are. The condition of your cervix. The position of the baby. The size of your birth canal. Tell a health  care provider about: Any allergies you have. All medicines you are taking, including vitamins, herbs, eye drops, creams, and over-the-counter medicines. Any problems you or your family members have had with anesthetic medicines. Any surgeries you have had. Any blood disorders you have. Any medical conditions you have. What are the risks? Generally, this is a safe procedure. However, problems may occur, including: Failed  induction. Changes in fetal heart rate, such as being too high, too low, or irregular (erratic). Infection in the mother or the baby. Increased risk of having a cesarean delivery. Breaking off (abruption) of the placenta from the uterus. This is rare. Rupture of the uterus. This is very rare. Your baby could fail to get enough blood flow or oxygen. This can be life-threatening. When induction is needed for medical reasons, the benefits generally outweigh the risks. What happens during the procedure? During the procedure, your health care provider will use one of these methods to induce labor: Stripping the membranes. In this method, the amniotic sac tissue is gently separated from the cervix. This causes the following to happen: Your cervix stretches, which in turn causes the release of prostaglandins. Prostaglandins induce labor and cause the uterus to contract. This procedure is often done in an office visit. You will be sent home to wait for contractions to begin. Prostaglandin medicine. This medicine starts contractions and causes the cervix to dilate and ripen. This can be taken by mouth (orally) or by being inserted into the vagina (suppository). Inserting a small, thin tube (catheter) with a balloon into the vagina and then expanding the balloon with water to dilate the cervix. Breaking the water. In this method, a small instrument is used to make a small hole in the amniotic sac. This eventually causes the amniotic sac to break. Contractions should begin within a few hours. Medicine to trigger or strengthen contractions. This medicine is given through an IV that is inserted into a vein in your arm. This procedure may vary among health care providers and hospitals. Where to find more information March of Dimes: www.marchofdimes.org [employer] of Obstetricians and Gynecologists: www.acog.org Summary Labor induction causes a pregnant woman's uterus to contract. It also causes  the cervix to soften (ripen), open (dilate), and thin out. Labor is usually not induced before 39 weeks of pregnancy unless there is a medical reason to do so. When induction is needed for medical reasons, the benefits generally outweigh the risks. Talk with your health care provider about which methods of labor induction are right for you. This information is not intended to replace advice given to you by your health care provider. Make sure you discuss any questions you have with your health care provider. Document Revised: 02/20/2020 Document Reviewed: 02/20/2020 Elsevier Patient Education  [place].

## 2021-06-22 NOTE — Progress Notes (Signed)
[name]: Doing well, no major issues.  Patient has questions regarding labor, induction if no labor ensues. Answered all questions. Scheduled IOL for 07/01/2021 at midnight. RTC in 1 week for OB visit and NST.

## 2021-06-28 ENCOUNTER — Encounter: Payer: Self-pay | Admitting: Obstetrics and Gynecology

## 2021-06-28 ENCOUNTER — Other Ambulatory Visit: Payer: Self-pay

## 2021-06-28 ENCOUNTER — Other Ambulatory Visit: Payer: BC Managed Care – PPO

## 2021-06-28 ENCOUNTER — Ambulatory Visit (INDEPENDENT_AMBULATORY_CARE_PROVIDER_SITE_OTHER): Payer: BC Managed Care – PPO | Admitting: Obstetrics and Gynecology

## 2021-06-28 VITALS — BP 115/62 | HR 90 | Wt 181.1 lb

## 2021-06-28 DIAGNOSIS — Z3403 Encounter for supervision of normal first pregnancy, third trimester: Secondary | ICD-10-CM

## 2021-06-28 DIAGNOSIS — Z3A40 40 weeks gestation of pregnancy: Secondary | ICD-10-CM

## 2021-06-28 LAB — POCT URINALYSIS DIPSTICK OB
Bilirubin, UA: NEGATIVE
Blood, UA: NEGATIVE
Glucose, UA: NEGATIVE
Ketones, UA: NEGATIVE
Leukocytes, UA: NEGATIVE
Nitrite, UA: NEGATIVE
POC,PROTEIN,UA: NEGATIVE
Spec Grav, UA: 1.01 (ref 1.010–1.025)
Urobilinogen, UA: 0.2 E.U./dL
pH, UA: 6 (ref 5.0–8.0)

## 2021-06-28 NOTE — Progress Notes (Signed)
[name]: Having mild contractions.  NST = negative CST today.  Patient for COVID testing tomorrow and induction midnight Wednesday.  Signs and symptoms of labor discussed.  Reports daily fetal movement.

## 2021-06-28 NOTE — Progress Notes (Signed)
[name]. Patient states fetal movement with pressure when using the bathroom. Patient states no concerns at this time.

## 2021-06-29 ENCOUNTER — Inpatient Hospital Stay: Payer: BC Managed Care – PPO | Admitting: Anesthesiology

## 2021-06-29 ENCOUNTER — Inpatient Hospital Stay
Admission: EM | Admit: 2021-06-29 | Discharge: 2021-06-30 | DRG: 807 | Disposition: A | Discharge status: Home / Self Care | Payer: BC Managed Care – PPO | Attending: Obstetrics and Gynecology | Admitting: Obstetrics and Gynecology

## 2021-06-29 ENCOUNTER — Other Ambulatory Visit: Payer: Self-pay

## 2021-06-29 ENCOUNTER — Other Ambulatory Visit: Admission: RE | Admit: 2021-06-29 | Payer: BC Managed Care – PPO | Source: Ambulatory Visit

## 2021-06-29 ENCOUNTER — Encounter: Payer: Self-pay | Admitting: Obstetrics and Gynecology

## 2021-06-29 DIAGNOSIS — O480 Post-term pregnancy: Principal | ICD-10-CM | POA: Diagnosis present

## 2021-06-29 DIAGNOSIS — E669 Obesity, unspecified: Secondary | ICD-10-CM | POA: Diagnosis present

## 2021-06-29 DIAGNOSIS — O99214 Obesity complicating childbirth: Secondary | ICD-10-CM | POA: Diagnosis not present

## 2021-06-29 DIAGNOSIS — O360130 Maternal care for anti-D [Rh] antibodies, third trimester, not applicable or unspecified: Secondary | ICD-10-CM | POA: Diagnosis not present

## 2021-06-29 DIAGNOSIS — Z3A40 40 weeks gestation of pregnancy: Secondary | ICD-10-CM | POA: Diagnosis not present

## 2021-06-29 DIAGNOSIS — Z6791 Unspecified blood type, Rh negative: Secondary | ICD-10-CM | POA: Diagnosis not present

## 2021-06-29 DIAGNOSIS — Z23 Encounter for immunization: Secondary | ICD-10-CM | POA: Diagnosis not present

## 2021-06-29 DIAGNOSIS — O26893 Other specified pregnancy related conditions, third trimester: Secondary | ICD-10-CM | POA: Diagnosis not present

## 2021-06-29 DIAGNOSIS — Z20822 Contact with and (suspected) exposure to covid-19: Secondary | ICD-10-CM | POA: Diagnosis not present

## 2021-06-29 DIAGNOSIS — O26899 Other specified pregnancy related conditions, unspecified trimester: Secondary | ICD-10-CM

## 2021-06-29 DIAGNOSIS — E66811 Obesity, class 1: Secondary | ICD-10-CM | POA: Diagnosis present

## 2021-06-29 DIAGNOSIS — Z058 Observation and evaluation of newborn for other specified suspected condition ruled out: Secondary | ICD-10-CM | POA: Diagnosis not present

## 2021-06-29 LAB — RESP PANEL BY RT-PCR (FLU A&B, COVID) ARPGX2
Influenza A by PCR: NEGATIVE
Influenza B by PCR: NEGATIVE
SARS Coronavirus 2 by RT PCR: NEGATIVE

## 2021-06-29 LAB — CBC
HCT: 36.2 % (ref 36.0–46.0)
Hemoglobin: 12.2 g/dL (ref 12.0–15.0)
MCH: 29.8 pg (ref 26.0–34.0)
MCHC: 33.7 g/dL (ref 30.0–36.0)
MCV: 88.3 fL (ref 80.0–100.0)
Platelets: 187 10*3/uL (ref 150–400)
RBC: 4.1 MIL/uL (ref 3.87–5.11)
RDW: 13.3 % (ref 11.5–15.5)
WBC: 15.9 10*3/uL — ABNORMAL HIGH (ref 4.0–10.5)
nRBC: 0 % (ref 0.0–0.2)

## 2021-06-29 LAB — ABO/RH: ABO/RH(D): O NEG

## 2021-06-29 LAB — RPR: RPR Ser Ql: NONREACTIVE

## 2021-06-29 MED ORDER — SOD CITRATE-CITRIC ACID 500-334 MG/5ML PO SOLN: 30.0000 mL | ORAL | Status: DC | PRN

## 2021-06-29 MED ORDER — ACETAMINOPHEN 325 MG PO TABS
650.0000 mg | ORAL_TABLET | ORAL | Status: DC | PRN
  Administered 2021-06-29: 650 mg via ORAL
  Filled 2021-06-29: qty 2

## 2021-06-29 MED ORDER — SENNOSIDES-DOCUSATE SODIUM 8.6-50 MG PO TABS
2.0000 | ORAL_TABLET | Freq: Every day | ORAL | Status: DC
  Administered 2021-06-30: 2 via ORAL
  Filled 2021-06-29: qty 2

## 2021-06-29 MED ORDER — OXYTOCIN-SODIUM CHLORIDE 30-0.9 UT/500ML-% IV SOLN
1.0000 m[IU]/min | INTRAVENOUS | Status: DC
  Administered 2021-06-29: 4 m[IU]/min via INTRAVENOUS

## 2021-06-29 MED ORDER — LACTATED RINGERS IV SOLN
500.0000 mL | INTRAVENOUS | Status: DC | PRN
  Administered 2021-06-29: 500 mL via INTRAVENOUS

## 2021-06-29 MED ORDER — WITCH HAZEL-GLYCERIN EX PADS: 1.0000 "application " | MEDICATED_PAD | CUTANEOUS | Status: DC | PRN

## 2021-06-29 MED ORDER — LIDOCAINE-EPINEPHRINE (PF) 1.5 %-1:200000 IJ SOLN
INTRAMUSCULAR | Status: DC | PRN
Start: 2021-06-29 — End: 2021-06-29
  Administered 2021-06-29: 4 mL via EPIDURAL

## 2021-06-29 MED ORDER — LIDOCAINE HCL (PF) 1 % IJ SOLN
30.0000 mL | INTRAMUSCULAR | Status: DC | PRN
  Filled 2021-06-29: qty 30

## 2021-06-29 MED ORDER — OXYTOCIN BOLUS FROM INFUSION
333.0000 mL | Freq: Once | INTRAVENOUS | Status: AC
  Administered 2021-06-29: 333 mL via INTRAVENOUS

## 2021-06-29 MED ORDER — LACTATED RINGERS IV SOLN: INTRAVENOUS | Status: DC

## 2021-06-29 MED ORDER — TERBUTALINE SULFATE 1 MG/ML IJ SOLN: 0.2500 mg | Freq: Once | INTRAMUSCULAR | Status: DC | PRN

## 2021-06-29 MED ORDER — FENTANYL-BUPIVACAINE-NACL 0.5-0.125-0.9 MG/250ML-% EP SOLN
12.0000 mL/h | EPIDURAL | Status: DC | PRN
  Administered 2021-06-29: 12 mL/h via EPIDURAL

## 2021-06-29 MED ORDER — BENZOCAINE-MENTHOL 20-0.5 % EX AERO
1.0000 "application " | INHALATION_SPRAY | CUTANEOUS | Status: DC | PRN
  Administered 2021-06-29: 1 via TOPICAL

## 2021-06-29 MED ORDER — ZOLPIDEM TARTRATE 5 MG PO TABS: 5.0000 mg | ORAL_TABLET | Freq: Every evening | ORAL | Status: DC | PRN

## 2021-06-29 MED ORDER — IBUPROFEN 600 MG PO TABS
600.0000 mg | ORAL_TABLET | Freq: Four times a day (QID) | ORAL | Status: DC
  Administered 2021-06-29 – 2021-06-30 (×3): 600 mg via ORAL
  Filled 2021-06-29 (×2): qty 1

## 2021-06-29 MED ORDER — PRENATAL MULTIVITAMIN CH
1.0000 | ORAL_TABLET | Freq: Every day | ORAL | Status: DC
  Administered 2021-06-30: 1 via ORAL
  Filled 2021-06-29: qty 1

## 2021-06-29 MED ORDER — ONDANSETRON HCL 4 MG PO TABS: 4.0000 mg | ORAL_TABLET | ORAL | Status: DC | PRN

## 2021-06-29 MED ORDER — EPHEDRINE 5 MG/ML INJ
10.0000 mg | INTRAVENOUS | Status: DC | PRN
  Filled 2021-06-29: qty 2

## 2021-06-29 MED ORDER — DIPHENHYDRAMINE HCL 25 MG PO CAPS: 25.0000 mg | ORAL_CAPSULE | Freq: Four times a day (QID) | ORAL | Status: DC | PRN

## 2021-06-29 MED ORDER — DIPHENHYDRAMINE HCL 50 MG/ML IJ SOLN: 12.5000 mg | INTRAMUSCULAR | Status: DC | PRN

## 2021-06-29 MED ORDER — BUTORPHANOL TARTRATE 1 MG/ML IJ SOLN: 1.0000 mg | INTRAMUSCULAR | Status: DC | PRN

## 2021-06-29 MED ORDER — ONDANSETRON HCL 4 MG/2ML IJ SOLN: 4.0000 mg | INTRAMUSCULAR | Status: DC | PRN

## 2021-06-29 MED ORDER — MISOPROSTOL 25 MCG QUARTER TABLET
50.0000 ug | ORAL_TABLET | ORAL | Status: DC | PRN
  Filled 2021-06-29: qty 2

## 2021-06-29 MED ORDER — IBUPROFEN 600 MG PO TABS
ORAL_TABLET | ORAL | Status: AC
  Filled 2021-06-29: qty 1

## 2021-06-29 MED ORDER — BUPIVACAINE HCL (PF) 0.25 % IJ SOLN
INTRAMUSCULAR | Status: DC | PRN
  Administered 2021-06-29: 3 mL via EPIDURAL
  Administered 2021-06-29: 5 mL via EPIDURAL

## 2021-06-29 MED ORDER — LIDOCAINE HCL (PF) 1 % IJ SOLN
INTRAMUSCULAR | Status: DC | PRN
  Administered 2021-06-29: 3 mL via SUBCUTANEOUS

## 2021-06-29 MED ORDER — LACTATED RINGERS IV SOLN: 500.0000 mL | Freq: Once | INTRAVENOUS | Status: AC

## 2021-06-29 MED ORDER — PHENYLEPHRINE 40 MCG/ML (10ML) SYRINGE FOR IV PUSH (FOR BLOOD PRESSURE SUPPORT)
80.0000 ug | PREFILLED_SYRINGE | INTRAVENOUS | Status: DC | PRN
  Filled 2021-06-29: qty 10

## 2021-06-29 MED ORDER — OXYTOCIN-SODIUM CHLORIDE 30-0.9 UT/500ML-% IV SOLN: 2.5000 [IU]/h | INTRAVENOUS | Status: DC

## 2021-06-29 MED ORDER — ONDANSETRON HCL 4 MG/2ML IJ SOLN
4.0000 mg | Freq: Four times a day (QID) | INTRAMUSCULAR | Status: DC | PRN
  Administered 2021-06-29: 4 mg via INTRAVENOUS
  Filled 2021-06-29: qty 2

## 2021-06-29 MED ORDER — DIBUCAINE (PERIANAL) 1 % EX OINT: 1.0000 "application " | TOPICAL_OINTMENT | CUTANEOUS | Status: DC | PRN

## 2021-06-29 MED ORDER — COCONUT OIL OIL
1.0000 "application " | TOPICAL_OIL | Status: DC | PRN
  Filled 2021-06-29: qty 120

## 2021-06-29 MED ORDER — ACETAMINOPHEN 325 MG PO TABS: 650.0000 mg | ORAL_TABLET | ORAL | Status: DC | PRN

## 2021-06-29 MED ORDER — FENTANYL-BUPIVACAINE-NACL 0.5-0.125-0.9 MG/250ML-% EP SOLN
EPIDURAL | Status: AC
  Filled 2021-06-29: qty 250

## 2021-06-29 MED ORDER — SIMETHICONE 80 MG PO CHEW: 80.0000 mg | CHEWABLE_TABLET | ORAL | Status: DC | PRN

## 2021-06-29 NOTE — Anesthesia Preprocedure Evaluation (Signed)
Anesthesia Evaluation  Patient identified by MRN, date of birth, ID band Patient awake    Reviewed: Allergy & Precautions, H&P , NPO status , Patient's Chart, lab work & pertinent test results, reviewed documented beta blocker date and time   Airway Mallampati: II  TM Distance: >3 FB Neck ROM: full    Dental no notable dental hx. (+) Teeth Intact   Pulmonary asthma ,    Pulmonary exam normal breath sounds clear to auscultation       Cardiovascular Exercise Tolerance: Good negative cardio ROS   Rhythm:regular Rate:Normal     Neuro/Psych negative neurological ROS  negative psych ROS   GI/Hepatic Neg liver ROS, GERD  Medicated,  Endo/Other  negative endocrine ROSdiabetes, Gestational  Renal/GU      Musculoskeletal   Abdominal   Peds  Hematology  (+) Blood dyscrasia, anemia ,   Anesthesia Other Findings   Reproductive/Obstetrics (+) Pregnancy                             Anesthesia Physical Anesthesia Plan  ASA: 2  Anesthesia Plan: Epidural   Post-op Pain Management:    Induction:   PONV Risk Score and Plan:   Airway Management Planned:   Additional Equipment:   Intra-op Plan:   Post-operative Plan:   Informed Consent: I have reviewed the patients History and Physical, chart, labs and discussed the procedure including the risks, benefits and alternatives for the proposed anesthesia with the patient or authorized representative who has indicated his/her understanding and acceptance.       Plan Discussed with:   Anesthesia Plan Comments:         Anesthesia Quick Evaluation

## 2021-06-29 NOTE — H&P (Signed)
Obstetric History and Physical  Christine Mcbride is a 26 y.o. G1P0 with IUP at [redacted]w[redacted]d presenting for regular contractions. Patient states she has been having  regular, every 2-3 minutes contractions since 2200 last night,  denies  vaginal bleeding, intact membranes, with active fetal movement.    Prenatal Course Source of Care: [facility] with onset of care at 7 weeks Pregnancy complications or risks: Patient Active Problem List   Diagnosis Date Noted   Post-dates pregnancy 06/29/2021   Obesity (BMI 30.0-34.9) 05/26/2021   Rh negative state in antepartum period 01/12/2021   Gastroesophageal reflux in pregnancy 01/12/2021   Hx of migraine headaches 01/12/2021   She plans to breastfeed She desires oral contraceptives (estrogen/progesterone) for postpartum contraception.   Prenatal labs and studies: ABO, Rh: --/--/O NEG Performed at [facility], [address] [place], [place] [address]  [phone]) Antibody: POS (02/07 0415) Rubella: 1.17 (06/13 1017) RPR: Non Reactive (11/16 0959)  HBsAg: Negative (06/13 1017)  HIV: Non Reactive (06/13 1017)  YBO:FBPZWCHE/-- (01/10 1008) 1 hr Glucola  normal (139) Genetic screening normal Anatomy US normal   Past Medical History:  Diagnosis Date   Anemia    Asthma    H/O dysmenorrhea    Hx of migraines    Irregular menstrual cycle     History reviewed. No pertinent surgical history.  OB History  Gravida Para Term Preterm AB Living  1            SAB IAB Ectopic Multiple Live Births               # Outcome Date GA Lbr Len/2nd Weight Sex Delivery Anes PTL Lv  1 Current             Social History   Socioeconomic History   Marital status: Single    Spouse name: Not on file   Number of children: Not on file   Years of education: Not on file   Highest education level: Not on file  Occupational History   Not on file  Tobacco Use   Smoking status: Never   Smokeless tobacco: Never  Vaping Use   Vaping  Use: Never used  Substance and Sexual Activity   Alcohol use: No   Drug use: No   Sexual activity: Yes    Partners: Male  Other Topics Concern   Not on file  Social History Narrative   Not on file   Social Determinants of Health   Financial Resource Strain: Not on file  Food Insecurity: Not on file  Transportation Needs: Not on file  Physical Activity: Not on file  Stress: Not on file  Social Connections: Not on file    Family History  Problem Relation Age of Onset   Ovarian cancer Mother    Diabetes Father    Diabetes Maternal Grandmother    Hypertension Maternal Grandfather    Diabetes Paternal Grandmother    Heart attack Paternal Grandmother     Medications Prior to Admission  Medication Sig Dispense Refill Last Dose   Prenatal Vit-Fe Fumarate-FA (PRENATAL MULTIVITAMIN) TABS tablet Take 1 tablet by mouth daily at 12 noon.       No Known Allergies  Review of Systems: Negative except for what is mentioned in HPI.  Physical Exam: BP (!) 113/59    Pulse 72    Temp 98.6 F (37 C) (Oral)    Resp 16    Ht 5\' 2"  (1.575 m)  Wt 82.1 kg    LMP 09/09/2020 (Within Days)    SpO2 99%    BMI 33.12 kg/m  CONSTITUTIONAL: Well-developed, well-nourished female in no acute distress.  HENT:  Normocephalic, atraumatic, External right and left ear normal. Oropharynx is clear and moist EYES: Conjunctivae and EOM are normal. Pupils are equal, round, and reactive to light. No scleral icterus.  NECK: Normal range of motion, supple, no masses SKIN: Skin is warm and dry. No rash noted. Not diaphoretic. No erythema. No pallor. NEUROLOGIC: Alert and oriented to person, place, and time. Normal reflexes, muscle tone coordination. No cranial nerve deficit noted. PSYCHIATRIC: Normal mood and affect. Normal behavior. Normal judgment and thought content. CARDIOVASCULAR: Normal heart rate noted, regular rhythm RESPIRATORY: Effort and breath sounds normal, no problems with respiration  noted ABDOMEN: Soft, nontender, nondistended, gravid. MUSCULOSKELETAL: Normal range of motion. No edema and no tenderness. 2+ distal pulses.  Cervical Exam: Dilatation 5 cm   Effacement 80%   Station -2   Presentation: cephalic FHT:  Baseline rate 145 bpm   Variability moderate  Accelerations present   Decelerations none Contractions: Every 2-5 mins   Pertinent Labs/Studies:   Results for orders placed or performed during the hospital encounter of 06/29/21 (from the past 24 hour(s))  CBC     Status: Abnormal   Collection Time: 06/29/21  4:15 AM  Result Value Ref Range   WBC 15.9 (H) 4.0 - 10.5 K/uL   RBC 4.10 3.87 - 5.11 MIL/uL   Hemoglobin 12.2 12.0 - 15.0 g/dL   HCT 51.7 00.1 - 74.9 %   MCV 88.3 80.0 - 100.0 fL   MCH 29.8 26.0 - 34.0 pg   MCHC 33.7 30.0 - 36.0 g/dL   RDW 44.9 67.5 - 91.6 %   Platelets 187 150 - 400 K/uL   nRBC 0.0 0.0 - 0.2 %  Type and screen     Status: None   Collection Time: 06/29/21  4:15 AM  Result Value Ref Range   ABO/RH(D) O NEG    Antibody Screen POS    Sample Expiration 07/02/2021,2359    Antibody Identification PASSIVELY ACQUIRED ANTI-D    Blood Bank Correction      PREVIOUSLY REPORTED AS: NEGATIVE ANTIBODY SCREEN RESULT MODIFIED ON: 06/29/21 AT 0600. C/KALEY ALLRED 06/29/21 0511 RH Performed at [facility], [address] [place], [place] [address]   Resp Panel by RT-PCR (Flu A&B, Covid) Nasopharyngeal Swab     Status: None   Collection Time: 06/29/21  4:15 AM   Specimen: Nasopharyngeal Swab; Nasopharyngeal(NP) swabs in vial transport medium  Result Value Ref Range   SARS Coronavirus 2 by RT PCR NEGATIVE NEGATIVE   Influenza A by PCR NEGATIVE NEGATIVE   Influenza B by PCR NEGATIVE NEGATIVE  ABO/Rh     Status: None   Collection Time: 06/29/21  6:48 AM  Result Value Ref Range   ABO/RH(D)      [place] NEG Performed at [facility], [address] [place], [place] [address]     Assessment : Mailee P Derossett is a 26 y.o.  G1P0 at [redacted]w[redacted]d being admitted for onset of labor    Plan: Labor: Expectant management.  Augment as needed. AROM'd with moderate meconium fluid. Analgesia as needed. FWB: Reassuring fetal heart tracing, Category 1.  GBS negative. Delivery plan: Hopeful for vaginal delivery.  Rhogam postpartum as indicated.   Hildred Laser, MD [facility]

## 2021-06-29 NOTE — Progress Notes (Signed)
Intrapartum Progress Note  S: Patient comfortable, no issues  O: Blood pressure (!) 113/59, pulse 72, temperature 98.6 F (37 C), temperature source Oral, resp. rate 16, height 5\' 2"  (1.575 m), weight 82.1 kg, last menstrual period 09/09/2020, SpO2 99 %. Gen App: NAD, comfortable Abdomen: soft, gravid FHT: baseline 125 bpm.  Accels present.  Decels absent. moderate in degree variability.   Tocometer: contractions q 2-5 minutes Cervix: unchanged, per nurse. Remains 5-6/80/+1 Extremities: Nontender, no edema.  Pitocin: None  Labs:  No new labs  Assessment:  1: SIUP at [redacted]w[redacted]d 2. AROM'd last check  Plan:  1. Will start augmentation with Pitocin.  2. Category 1 fetal tracing, continue to monitor.  3. Anticipate vaginal delivery.    Rubie Maid, MD [facility]

## 2021-06-29 NOTE — Anesthesia Procedure Notes (Signed)
Epidural Patient location during procedure: OB End time: 06/29/2021 5:35 AM  Staffing Anesthesiologist: Yevette Edwards, MD Performed: anesthesiologist   Preanesthetic Checklist Completed: patient identified, IV checked, site marked, risks and benefits discussed, surgical consent, monitors and equipment checked, pre-op evaluation and timeout performed  Epidural Patient position: sitting Prep: ChloraPrep Patient monitoring: heart rate, continuous pulse ox and blood pressure Approach: midline Location: L4-L5 Injection technique: LOR saline  Needle:  Needle type: Tuohy  Needle gauge: 17 G Needle length: 9 cm and 9 Needle insertion depth: 6 cm Catheter type: closed end flexible Catheter size: 19 Gauge Catheter at skin depth: 12 cm Test dose: negative and 1.5% lidocaine with Epi 1:200 K  Assessment Sensory level: T10 Events: blood not aspirated, injection not painful, no injection resistance, no paresthesia and negative IV test  Additional Notes 1st attempt Pt. Evaluated and documentation done after procedure finished. Patient identified. Risks/Benefits/Options discussed with patient including but not limited to bleeding, infection, nerve damage, paralysis, failed block, incomplete pain control, headache, blood pressure changes, nausea, vomiting, reactions to medication both or allergic, itching and postpartum back pain. Confirmed with bedside nurse the patient's most recent platelet count. Confirmed with patient that they are not currently taking any anticoagulation, have any bleeding history or any family history of bleeding disorders. Patient expressed understanding and wished to proceed. All questions were answered. Sterile technique was used throughout the entire procedure. Please see nursing notes for vital signs. Test dose was given through epidural catheter and negative prior to continuing to dose epidural or start infusion. Warning signs of high block given to the patient  including shortness of breath, tingling/numbness in hands, complete motor block, or any concerning symptoms with instructions to call for help. Patient was given instructions on fall risk and not to get out of bed. All questions and concerns addressed with instructions to call with any issues or inadequate analgesia.    Patient tolerated the insertion well without immediate complications.Reason for block:procedure for pain

## 2021-06-29 NOTE — OB Triage Note (Signed)
Patient is a 26 yo, G1P0, at 40 weeks 4 days. Patient presents with complaints of contractions and vaginal spotting. Patient denies any LOF and reports +FM. Monitors applied and assessing. VSS. Initial fetal heart tone 150. Will continue to monitor.

## 2021-06-29 NOTE — Lactation Note (Signed)
This note was copied from a baby's chart. Lactation Consultation Note  Patient Name: Christine Mcbride Today's Date: 06/29/2021 Reason for consult: L&D Initial assessment;Primapara;1st time breastfeeding;Term Age:26 hours  Maternal Data Does the patient have breastfeeding experience prior to this delivery?: No  Feeding Mother's Current Feeding Choice: Breast Milk First feeding after delivery, latched easily to left breast and nursed well without stimulation in cradle hold x 25 min, assisted with switching to right breast and latched easily to breast in cradle hold, nursed x 10 min and mom needed to go to BR, so suction broken at breast by mom's finger and baby was given to dad of baby, mom encouraged to  nurse with cues.  LATCH Score Latch: Grasps breast easily, tongue down, lips flanged, rhythmical sucking.  Audible Swallowing: A few with stimulation  Type of Nipple: Everted at rest and after stimulation  Comfort (Breast/Nipple): Soft / non-tender  Hold (Positioning): Assistance needed to correctly position infant at breast and maintain latch.  LATCH Score: 8   Lactation Tools Discussed/Used    Interventions Interventions: Breast feeding basics reviewed;Assisted with latch;Skin to skin;Adjust position;Support pillows;Position options;Education  Discharge Pump: Personal WIC Program: No  Consult Status Consult Status: Follow-up from L&D    Dyann Kief 06/29/2021, 5:21 PM

## 2021-06-30 DIAGNOSIS — O480 Post-term pregnancy: Principal | ICD-10-CM

## 2021-06-30 DIAGNOSIS — O360130 Maternal care for anti-D [Rh] antibodies, third trimester, not applicable or unspecified: Secondary | ICD-10-CM

## 2021-06-30 DIAGNOSIS — O99214 Obesity complicating childbirth: Secondary | ICD-10-CM

## 2021-06-30 DIAGNOSIS — E669 Obesity, unspecified: Secondary | ICD-10-CM

## 2021-06-30 DIAGNOSIS — Z3A40 40 weeks gestation of pregnancy: Secondary | ICD-10-CM

## 2021-06-30 LAB — FETAL SCREEN: Fetal Screen: NEGATIVE

## 2021-06-30 LAB — CBC
HCT: 30.1 % — ABNORMAL LOW (ref 36.0–46.0)
Hemoglobin: 10.2 g/dL — ABNORMAL LOW (ref 12.0–15.0)
MCH: 30.4 pg (ref 26.0–34.0)
MCHC: 33.9 g/dL (ref 30.0–36.0)
MCV: 89.6 fL (ref 80.0–100.0)
Platelets: 147 10*3/uL — ABNORMAL LOW (ref 150–400)
RBC: 3.36 MIL/uL — ABNORMAL LOW (ref 3.87–5.11)
RDW: 13.5 % (ref 11.5–15.5)
WBC: 15 10*3/uL — ABNORMAL HIGH (ref 4.0–10.5)
nRBC: 0 % (ref 0.0–0.2)

## 2021-06-30 MED ORDER — RHO D IMMUNE GLOBULIN 1500 UNIT/2ML IJ SOSY
300.0000 ug | PREFILLED_SYRINGE | Freq: Once | INTRAMUSCULAR | Status: AC
  Administered 2021-06-30: 300 ug via INTRAVENOUS
  Filled 2021-06-30: qty 2

## 2021-06-30 MED ORDER — IBUPROFEN 600 MG PO TABS: 600.0000 mg | ORAL_TABLET | Freq: Four times a day (QID) | ORAL | 1 refills | Status: DC

## 2021-06-30 NOTE — Progress Notes (Signed)
Mother discharged.  Discharge instructions given.  Mother verbalizes understanding.  Transported by auxiliary.  

## 2021-06-30 NOTE — Progress Notes (Signed)
Post Partum Day # 1, s/p SVD  Subjective: no complaints, up ad lib, voiding, and tolerating PO  Objective: Temp:  [97.7 F (36.5 C)-99 F (37.2 C)] 98 F (36.7 C) (02/08 0453) Pulse Rate:  [77-98] 84 (02/08 0731) Resp:  [18-20] 18 (02/08 0731) BP: (106-130)/(51-77) 130/73 (02/08 0731) SpO2:  [99 %-100 %] 99 % (02/08 0731)  Physical Exam:  General: alert and no distress  Lungs: clear to auscultation bilaterally Breasts: normal appearance, no masses or tenderness Heart: regular rate and rhythm, S1, S2 normal, no murmur, click, rub or gallop Abdomen: soft, non-tender; bowel sounds normal; no masses,  no organomegaly Pelvis: Lochia: appropriate, Uterine Fundus: firm Extremities: DVT Evaluation: No evidence of DVT seen on physical exam. Negative Homan's sign. No cords or calf tenderness. No significant calf/ankle edema.  Recent Labs    06/29/21 0415 06/30/21 0512  HGB 12.2 10.2*  HCT 36.2 30.1*    Assessment/Plan: Doing well postpartum Breastfeeding, Lactation consult,  Circumcision prior to discharge,  Contraception OCPs.  Plan for discharge later today.    LOS: 1 day   Hildred Laser, MD [facility] 06/30/2021 8:29 AM

## 2021-06-30 NOTE — Discharge Instructions (Signed)

## 2021-06-30 NOTE — Anesthesia Postprocedure Evaluation (Signed)
Anesthesia Post Note  Patient: Christine Mcbride  Procedure(s) Performed: AN AD HOC LABOR EPIDURAL  Patient location during evaluation: Mother Baby Anesthesia Type: Epidural Level of consciousness: awake and alert Pain management: pain level controlled Vital Signs Assessment: post-procedure vital signs reviewed and stable Respiratory status: spontaneous breathing, nonlabored ventilation and respiratory function stable Cardiovascular status: stable Postop Assessment: no headache, no backache and epidural receding Anesthetic complications: no   No notable events documented.   Last Vitals:  Vitals:   06/30/21 0453 06/30/21 0731  BP: (!) 109/51 130/73  Pulse:  84  Resp: 18 18  Temp: 36.7 C   SpO2:  99%    Last Pain:  Vitals:   06/30/21 0453  TempSrc: Oral  PainSc:                  Elmarie Mainland

## 2021-06-30 NOTE — TOC Initial Note (Signed)
Transition of Care [facility]) - Initial/Assessment Note    Patient Details  Name: Christine Mcbride MRN: [id number] Date of Birth: 06/07/1995  Transition of [facility]) CM/SW Contact:    Anselm Pancoast, RN Phone Number: 06/30/2021, 3:27 PM  Clinical Narrative:                 Spoke with patient with FOB at bedside. Patient reports she is feeling great and breastfeeding is going very well. Reports strong support system and will be staying at home with infant. Has breast pump and all needed equipment. No history of anxiety or depression and understands PPD. Is still reviewing pediatricians for infant but will make selection before discharge. No issues with transportation and no other needs or concerns.            Patient Goals and CMS Choice        Expected Discharge Plan and Services           Expected Discharge Date: 06/30/21                                    Prior Living Arrangements/Services                       Activities of Daily Living Home Assistive Devices/Equipment: None ADL Screening (condition at time of admission) Patient's cognitive ability adequate to safely complete daily activities?: Yes Is the patient deaf or have difficulty hearing?: No Does the patient have difficulty seeing, even when wearing glasses/contacts?: No Does the patient have difficulty concentrating, remembering, or making decisions?: No Patient able to express need for assistance with ADLs?: Yes Does the patient have difficulty dressing or bathing?: No Independently performs ADLs?: Yes (appropriate for developmental age) Does the patient have difficulty walking or climbing stairs?: No Weakness of Legs: None Weakness of Arms/Hands: None  Permission Sought/Granted                  Emotional Assessment              Admission diagnosis:  Post-dates pregnancy [O48.0] Patient Active Problem List   Diagnosis Date Noted   Post-dates pregnancy 06/29/2021   Obesity  (BMI 30.0-34.9) 05/26/2021   Rh negative state in antepartum period 01/12/2021   Gastroesophageal reflux in pregnancy 01/12/2021   Hx of migraine headaches 01/12/2021   PCP:  Pcp, No Pharmacy:   [id number] - [place], [place] [address] Phone: [phone] Fax: [phone]     Social Determinants of Health (SDOH) Interventions    Readmission Risk Interventions No flowsheet data found.

## 2021-06-30 NOTE — Discharge Summary (Signed)
Postpartum Discharge Summary      Patient Name: Christine Mcbride DOB: [birth year] MRN: [id number]  Date of admission: 06/29/2021 Delivery date:06/29/2021  Delivering provider: Rubie Maid  Date of discharge: 06/30/2021  Admitting diagnosis: Post-dates pregnancy [O48.0] Intrauterine pregnancy: [redacted]w[redacted]d    Secondary diagnosis:  Principal Problem:   Post-dates pregnancy Active Problems:   Rh negative state in antepartum period   Obesity (BMI 30.0-34.9)  Additional problems: None    Discharge diagnosis: Term Pregnancy Delivered                                              Post partum procedures:rhogam Augmentation: AROM and Pitocin Complications: None  Hospital course: Onset of Labor With Vaginal Delivery      26y.o. yo G1P1001 at 26w4das admitted in Active Labor on 06/29/2021. Patient had an uncomplicated labor course as follows:  Membrane Rupture Time/Date: 8:10 AM ,06/29/2021   Delivery Method:Vaginal, Spontaneous  Episiotomy: None  Lacerations:  None  Patient had an uncomplicated postpartum course.  She is ambulating, tolerating a regular diet, passing flatus, and urinating well. Patient is discharged home in stable condition on 07/02/21.  Newborn Data: Birth date:06/29/2021  Birth time:3:54 PM  Gender:Female  Living status:Living  Apgars:8 ,9  Weight:3450 g   Magnesium Sulfate received: No BMZ received: No Rhophylac:Yes MMR:No T-DaP:Given prenatally Flu: No, declined Transfusion:No  Physical exam  Vitals:   06/30/21 0453 06/30/21 0731 06/30/21 0940 06/30/21 1120  BP: (!) 109/51 130/73 120/68 118/61  Pulse:  84 72 92  Resp: _0 Temp: 98 F (36.7 C)  97.7 F (36.5 C) 98.8 F (37.1 C)  TempSrc: Oral  Oral Oral  SpO2:  99%    Weight:      Height:       General: alert, cooperative, and no distress Lochia: appropriate Uterine Fundus: firm Incision: N/A DVT Evaluation: No evidence of DVT seen on physical exam. Negative Homan's sign. No cords or calf  tenderness. No significant calf/ankle edema.   Labs: Lab Results  Component Value Date   WBC 15.0 (H) 06/30/2021   HGB 10.2 (L) 06/30/2021   HCT 30.1 (L) 06/30/2021   MCV 89.6 06/30/2021   PLT 147 (L) 06/30/2021    Edinburgh Score: Edinburgh Postnatal Depression Scale Screening Tool 06/30/2021  I have been able to laugh and see the funny side of things. 0  I have looked forward with enjoyment to things. 0  I have blamed myself unnecessarily when things went wrong. 2  I have been anxious or worried for no good reason. 2  I have felt scared or panicky for no good reason. 2  Things have been getting on top of me. 2  I have been so unhappy that I have had difficulty sleeping. 0  I have felt sad or miserable. 1  I have been so unhappy that I have been crying. 1  The thought of harming myself has occurred to me. 0  Edinburgh Postnatal Depression Scale Total 10      After visit meds:  Allergies as of 06/30/2021   No Known Allergies      Medication List     TAKE these medications    ibuprofen 600 MG tablet Commonly known as: ADVIL Take 1 tablet (600 mg total) by mouth every 6 (six) hours.   prenatal  multivitamin Tabs tablet Take 1 tablet by mouth daily at 12 noon.         Discharge home in stable condition Infant Feeding: Breast Infant Disposition:home with mother Discharge instruction: per After Visit Summary and Postpartum booklet. Activity: Advance as tolerated. Pelvic rest for 6 weeks.  Diet: routine diet Anticipated Birth Control: OCPs Postpartum Appointment:6 weeks Additional Postpartum F/U: Postpartum Depression checkup in 2-3 weeks Future Appointments:No future appointments. Follow up Visit:  Follow-up Information     Rubie Maid, MD Follow up.   Specialties: Obstetrics and Gynecology, Radiology Why: 2-3 weeks for postpartum mood check televisit 6 week postpartum visit Contact information: [address] [place]  [address] [phone]                     06/30/2021 Rubie Maid, MD [facility]

## 2021-07-01 LAB — RHOGAM INJECTION: Unit division: 0

## 2021-07-02 DIAGNOSIS — R21 Rash and other nonspecific skin eruption: Secondary | ICD-10-CM | POA: Diagnosis not present

## 2021-07-02 DIAGNOSIS — Z00110 Health examination for newborn under 8 days old: Secondary | ICD-10-CM | POA: Diagnosis not present

## 2021-07-08 LAB — TYPE AND SCREEN
ABO/RH(D): O NEG
Antibody Screen: POSITIVE

## 2021-07-19 DIAGNOSIS — R21 Rash and other nonspecific skin eruption: Secondary | ICD-10-CM | POA: Diagnosis not present

## 2021-07-19 DIAGNOSIS — Z00111 Health examination for newborn 8 to 28 days old: Secondary | ICD-10-CM | POA: Diagnosis not present

## 2021-07-22 NOTE — Progress Notes (Signed)
Patient presents for postpartum mood check. Is 4 weeks postpartum, doing well.  ? ? ? ?Christine Mcbride, Crysal L, CMA ?[facility] ?

## 2021-07-23 ENCOUNTER — Telehealth (INDEPENDENT_AMBULATORY_CARE_PROVIDER_SITE_OTHER): Payer: BC Managed Care – PPO | Admitting: Obstetrics and Gynecology

## 2021-07-23 ENCOUNTER — Encounter: Payer: Self-pay | Admitting: Obstetrics and Gynecology

## 2021-07-23 ENCOUNTER — Other Ambulatory Visit: Payer: Self-pay | Admitting: Obstetrics and Gynecology

## 2021-07-23 VITALS — Ht 62.01 in

## 2021-07-23 DIAGNOSIS — Z1332 Encounter for screening for maternal depression: Secondary | ICD-10-CM

## 2021-07-23 MED ORDER — SLYND 4 MG PO TABS: 1.0000 | ORAL_TABLET | Freq: Every day | ORAL | 3 refills | Status: DC

## 2021-07-23 NOTE — Progress Notes (Signed)
? ?  Virtual Visit via Video Note ? ?I connected with Christine Mcbride on 07/23/21 at  8:30 AM EST ? by a video enabled telemedicine application and verified that I am speaking with the correct person using two identifiers. ? ?Location: ?Patient: Home ?Provider: Office ?  ?I discussed the limitations of evaluation and management by telemedicine and the availability of in person appointments. The patient expressed understanding and agreed to proceed. ? ?  ?History of Present Illness: ?  ?Christine Mcbride is a 26 y.o. G41P1001 female who presents for a 4 week televisit for mood check. She is 4 weeks postpartum following a spontaneous vaginal delivery.  The delivery was at 40.4 gestational weeks.  Postpartum course has been well so far. Baby is feeding by breast pumping. Bleeding: is mostly resolved. Postpartum depression screening: negative.  EDPS score is 2.  ?  ?  ?The following portions of the patient's history were reviewed and updated as appropriate: allergies, current medications, past family history, past medical history, past social history, past surgical history, and problem list. ?  ?Observations/Objective: ?  ?Height 5' 2.01" (1.575 m), last menstrual period 09/09/2020, currently breastfeeding. ?Gen App: NAD ?Psych: normal speech, affect. Good mood.  ?  ? ?Edinburgh Postnatal Depression Scale Screening Tool 07/23/2021 06/30/2021 06/30/2021 06/29/2021  ?I have been able to laugh and see the funny side of things. 0 0 (No Data) (No Data)  ?I have looked forward with enjoyment to things. 0 0 - -  ?I have blamed myself unnecessarily when things went wrong. 0 2 - -  ?I have been anxious or worried for no good reason. 1 2 - -  ?I have felt scared or panicky for no good reason. 0 2 - -  ?Things have been getting on top of me. 1 2 - -  ?I have been so unhappy that I have had difficulty sleeping. 0 0 - -  ?I have felt sad or miserable. 0 1 - -  ?I have been so unhappy that I have been crying. 0 1 - -  ?The thought of  harming myself has occurred to me. 0 0 - -  ?Edinburgh Postnatal Depression Scale Total 2 10 - -  ?  ? ? ? ?  ?Assessment and Plan: ?  ?1. Encounter for screening for maternal depression ?- Screening negative today. Will rescreen at 6 week postpartum visit. Overall doing well.  ?  ?2. Postpartum state ?- Overall doing well. Continue routine postpartum home care.  ?  ?3. Lactating mother ?- Breast pumping going well, infant gaining appropriate weight. Notes adequate milk supply.  ?  ?Follow Up Instructions: ? RTC in 2 weeks for postpartum visit.  ?  ?I discussed the assessment and treatment plan with the patient. The patient was provided an opportunity to ask questions and all were answered. The patient agreed with the plan and demonstrated an understanding of the instructions. ?  ?The patient was advised to call back or seek an in-person evaluation if the symptoms worsen or if the condition fails to improve as anticipated. ?  ?I provided 6 minutes of non-face-to-face time during this encounter. ?  ?  ?Hildred Laser, MD ?[facility] ? ?

## 2021-08-06 LAB — FETAL NONSTRESS TEST

## 2021-08-10 ENCOUNTER — Encounter: Payer: Self-pay | Admitting: Obstetrics and Gynecology

## 2021-08-10 ENCOUNTER — Other Ambulatory Visit: Payer: Self-pay

## 2021-08-10 ENCOUNTER — Ambulatory Visit (INDEPENDENT_AMBULATORY_CARE_PROVIDER_SITE_OTHER): Payer: BC Managed Care – PPO | Admitting: Obstetrics and Gynecology

## 2021-08-10 NOTE — Progress Notes (Signed)
? ?  OBSTETRICS POSTPARTUM CLINIC PROGRESS NOTE ? ?Subjective:  ?  ? Christine Mcbride is a 26 y.o. G54P1001 female who presents for a postpartum visit. She is 6 weeks postpartum following a spontaneous vaginal delivery. I have fully reviewed the prenatal and intrapartum course. The delivery was at 40 gestational weeks.  Anesthesia: epidural. Postpartum course has been uncomplicated. Baby's course has been uncomplicated. Baby is feeding by both breast and bottle - [employer] formula, due to decreased milk supply . Bleeding: patient has not resumed menses, with Patient's last menstrual period was 09/09/2020 (within days). Bowel function is normal. Bladder function is normal. Patient is sexually active. Contraception method desired is  progesterone-OCPs.  Started on medication yesterday . EDPS score is .  ? ? ?The following portions of the patient's history were reviewed and updated as appropriate: allergies, current medications, past family history, past medical history, past social history, past surgical history, and problem list. ? ?Review of Systems ?Pertinent items noted in HPI and remainder of comprehensive ROS otherwise negative.  ? ?Objective:  ? ? BP (!) 105/48   Pulse (!) 141   Resp 16   Ht 5\' 2"  (1.575 m)   Wt 144 lb 3.2 oz (65.4 kg)   LMP 09/09/2020 (Within Days)   Breastfeeding Yes   BMI 26.37 kg/m?   ?General:  alert and no distress  ? Breasts:  inspection negative, no nipple discharge or bleeding, no masses or nodularity palpable  ?Lungs: clear to auscultation bilaterally  ?Heart:  regular rate and rhythm, S1, S2 normal, no murmur, click, rub or gallop  ?Abdomen: soft, non-tender; bowel sounds normal; no masses,  no organomegaly.    ? Vulva:  normal  ?Vagina: normal vagina, no discharge, exudate, lesion, or erythema  ?Cervix:  no cervical motion tenderness and no lesions  ?Corpus: normal size, contour, position, consistency, mobility, non-tender  ?Adnexa:  normal adnexa and no mass,  fullness, tenderness  ?Rectal Exam: Not performed.  ?      ? ?Labs:  ?Lab Results  ?Component Value Date  ? HGB 10.2 (L) 06/30/2021  ? ? ? ?Assessment:  ? ?No diagnosis found.  ? ?Plan:  ? ?1. Contraception:  Micronor ?2. Lactating mother, also supplementing with formula, doing well.  ?3. Follow up in:  4-6  months for annual exam.  ? ? ?08/28/2021, MD ?[facility] ? ?

## 2021-08-10 NOTE — Patient Instructions (Signed)

## 2021-09-30 ENCOUNTER — Encounter: Payer: BC Managed Care – PPO | Admitting: Obstetrics and Gynecology

## 2021-10-05 ENCOUNTER — Telehealth: Payer: Self-pay | Admitting: Obstetrics and Gynecology

## 2021-10-05 NOTE — Telephone Encounter (Signed)
Pt called with concerns of a possible UTI- pt states hx of UTI, she is having freq urge to pee, no pain or burning, feels irritated. Pt states feeling present 2 days now, consulted clinical staff who advised getting pt in tom at 3 on AT schedule- only opening for any provider, directed pt to increase water intake, can use cranberry juice, and tylenol if needed. Advised not to use AZO as it can contaminate urine culture. Pt verbalized understanding.  ?

## 2021-10-06 ENCOUNTER — Ambulatory Visit (INDEPENDENT_AMBULATORY_CARE_PROVIDER_SITE_OTHER): Payer: BC Managed Care – PPO | Admitting: Certified Nurse Midwife

## 2021-10-06 VITALS — BP 98/61 | HR 82 | Temp 98.4°F | Ht 61.0 in | Wt 153.9 lb

## 2021-10-06 DIAGNOSIS — R399 Unspecified symptoms and signs involving the genitourinary system: Secondary | ICD-10-CM

## 2021-10-06 MED ORDER — NITROFURANTOIN MONOHYD MACRO 100 MG PO CAPS: 100.0000 mg | ORAL_CAPSULE | Freq: Two times a day (BID) | ORAL | 0 refills | Status: AC

## 2021-10-06 NOTE — Addendum Note (Signed)
Addended by: Tommie Raymond on: 10/06/2021 05:13 PM ? ? Modules accepted: Orders ? ?

## 2021-10-06 NOTE — Progress Notes (Signed)
Nurse visit for urine drop off. Did not see patient  ? ?Doreene Burke, CNM ?

## 2021-10-08 LAB — URINE CULTURE

## 2022-02-10 ENCOUNTER — Encounter: Payer: BC Managed Care – PPO | Admitting: Obstetrics and Gynecology

## 2022-03-01 ENCOUNTER — Encounter: Payer: Self-pay | Admitting: Obstetrics and Gynecology

## 2022-07-28 ENCOUNTER — Other Ambulatory Visit: Payer: Self-pay | Admitting: Obstetrics and Gynecology

## 2022-10-21 ENCOUNTER — Other Ambulatory Visit: Payer: Self-pay

## 2022-10-31 ENCOUNTER — Other Ambulatory Visit: Payer: Self-pay

## 2022-10-31 MED ORDER — NORETHINDRONE 0.35 MG PO TABS: 1.0000 | ORAL_TABLET | Freq: Every day | ORAL | 0 refills | Status: DC

## 2022-11-01 ENCOUNTER — Ambulatory Visit: Payer: BC Managed Care – PPO | Admitting: Obstetrics and Gynecology

## 2022-12-22 ENCOUNTER — Encounter: Payer: Self-pay | Admitting: Obstetrics and Gynecology

## 2022-12-22 ENCOUNTER — Other Ambulatory Visit (HOSPITAL_COMMUNITY)
Admission: RE | Admit: 2022-12-22 | Discharge: 2022-12-22 | Disposition: A | Discharge status: Home / Self Care | Payer: BC Managed Care – PPO | Source: Ambulatory Visit | Attending: Obstetrics and Gynecology | Admitting: Obstetrics and Gynecology

## 2022-12-22 ENCOUNTER — Ambulatory Visit: Payer: BC Managed Care – PPO | Admitting: Obstetrics and Gynecology

## 2022-12-22 VITALS — BP 96/60 | HR 85 | Ht 61.0 in | Wt 151.0 lb

## 2022-12-22 DIAGNOSIS — Z124 Encounter for screening for malignant neoplasm of cervix: Secondary | ICD-10-CM | POA: Insufficient documentation

## 2022-12-22 DIAGNOSIS — Z3041 Encounter for surveillance of contraceptive pills: Secondary | ICD-10-CM

## 2022-12-22 DIAGNOSIS — Z01419 Encounter for gynecological examination (general) (routine) without abnormal findings: Secondary | ICD-10-CM

## 2022-12-22 MED ORDER — NORETHINDRONE 0.35 MG PO TABS: 1.0000 | ORAL_TABLET | Freq: Every day | ORAL | 3 refills | Status: DC

## 2022-12-22 NOTE — Progress Notes (Signed)
PCP:  Pcp, No   Chief Complaint  Patient presents with   Gynecologic Exam    No concerns     HPI:      [name] is a 27 y.o. G1P1001 whose LMP was Patient's last menstrual period was 11/27/2022 (approximate)., presents today for her annual examination.  Her menses are regular every 28-30 days, lasting 3-4 days on POPs with 1 heavy day.  Dysmenorrhea mild, no BTB.  Sex activity: single partner, contraception - POPs due to migraines; no pain/bleeding/dryness Last Pap: 09/26/19 Results were: no abnormalities   There is no FH of breast cancer. There is no FH of ovarian cancer. The patient does do self-breast exams.  Tobacco use: vapes occas Alcohol use: none No drug use.  Exercise: moderately active  She does get adequate calcium but not Vitamin D in her diet. Unsure if Delene Ruffini done  Patient Active Problem List   Diagnosis Date Noted   Post-dates pregnancy 06/29/2021   Obesity (BMI 30.0-34.9) 05/26/2021   Rh negative state in antepartum period 01/12/2021   Gastroesophageal reflux in pregnancy 01/12/2021   Hx of migraine headaches 01/12/2021    Past Surgical History:  Procedure Laterality Date   WISDOM TOOTH EXTRACTION      Family History  Problem Relation Age of Onset   Other Mother        cervical dysplasia   Skin cancer Mother    Diabetes Father    Diabetes Maternal Grandmother    Hypertension Maternal Grandfather    Diabetes Paternal Grandmother    Heart attack Paternal Grandmother     Social History   Socioeconomic History   Marital status: Single    Spouse name: Not on file   Number of children: Not on file   Years of education: Not on file   Highest education level: Not on file  Occupational History   Not on file  Tobacco Use   Smoking status: Never   Smokeless tobacco: Never  Vaping Use   Vaping status: Some Days  Substance and Sexual Activity   Alcohol use: Yes    Comment: occ   Drug use: No   Sexual activity: Yes    Partners:  Male    Birth control/protection: Pill  Other Topics Concern   Not on file  Social History Narrative   Not on file   Social Determinants of Health   Financial Resource Strain: Not on file  Food Insecurity: Not on file  Transportation Needs: Not on file  Physical Activity: Not on file  Stress: Not on file  Social Connections: Not on file  Intimate Partner Violence: Not on file     Current Outpatient Medications:    norethindrone (INCASSIA) 0.35 MG tablet, Take 1 tablet (0.35 mg total) by mouth daily., Disp: 84 tablet, Rfl: 3     ROS:  Review of Systems  Constitutional:  Negative for fatigue, fever and unexpected weight change.  Respiratory:  Negative for cough, shortness of breath and wheezing.   Cardiovascular:  Negative for chest pain, palpitations and leg swelling.  Gastrointestinal:  Negative for blood in stool, constipation, diarrhea, nausea and vomiting.  Endocrine: Negative for cold intolerance, heat intolerance and polyuria.  Genitourinary:  Negative for dyspareunia, dysuria, flank pain, frequency, genital sores, hematuria, menstrual problem, pelvic pain, urgency, vaginal bleeding, vaginal discharge and vaginal pain.  Musculoskeletal:  Negative for back pain, joint swelling and myalgias.  Skin:  Negative for rash.  Neurological:  Negative for dizziness, syncope, light-headedness,  numbness and headaches.  Hematological:  Negative for adenopathy.  Psychiatric/Behavioral:  Negative for agitation, confusion, sleep disturbance and suicidal ideas. The patient is not nervous/anxious.    BREAST: No symptoms   Objective: BP 96/60   Pulse 85   Ht 5\' 1"  (1.549 m)   Wt 151 lb (68.5 kg)   LMP 11/27/2022 (Approximate)   Breastfeeding No   BMI 28.53 kg/m    Physical Exam Constitutional:      Appearance: She is well-developed.  Genitourinary:     Vulva normal.     Right Labia: No rash, tenderness or lesions.    Left Labia: No tenderness, lesions or rash.    No  vaginal discharge, erythema or tenderness.      Right Adnexa: not tender and no mass present.    Left Adnexa: not tender and no mass present.    No cervical friability or polyp.     Uterus is not enlarged or tender.  Breasts:    Right: No mass, nipple discharge, skin change or tenderness.     Left: No mass, nipple discharge, skin change or tenderness.  Neck:     Thyroid: No thyromegaly.  Cardiovascular:     Rate and Rhythm: Normal rate and regular rhythm.     Heart sounds: Normal heart sounds. No murmur heard. Pulmonary:     Effort: Pulmonary effort is normal.     Breath sounds: Normal breath sounds.  Abdominal:     Palpations: Abdomen is soft.     Tenderness: There is no abdominal tenderness. There is no guarding or rebound.  Musculoskeletal:        General: Normal range of motion.     Cervical back: Normal range of motion.  Lymphadenopathy:     Cervical: No cervical adenopathy.  Neurological:     General: No focal deficit present.     Mental Status: She is alert and oriented to person, place, and time.     Cranial Nerves: No cranial nerve deficit.  Skin:    General: Skin is warm and dry.  Psychiatric:        Mood and Affect: Mood normal.        Behavior: Behavior normal.        Thought Content: Thought content normal.        Judgment: Judgment normal.  Vitals reviewed.    Assessment/Plan: Encounter for annual routine gynecological examination  Cervical cancer screening - Plan: Cytology - PAP  Encounter for surveillance of contraceptive pills - Plan: norethindrone (INCASSIA) 0.35 MG tablet; Rx RF  Meds ordered this encounter  Medications   norethindrone (INCASSIA) 0.35 MG tablet    Sig: Take 1 tablet (0.35 mg total) by mouth daily.    Dispense:  84 tablet    Refill:  3    Order Specific Question:   Supervising Provider    Answer:   Waymon Budge             GYN counsel adequate intake of calcium and vitamin D, diet and exercise; d/c vaping; Gardasil  check with her mom     F/U  Return in about 1 year (around 12/22/2023).  Christine Marley B. Bijan Ridgley, PA-C 12/22/2022 2:58 PM

## 2022-12-22 NOTE — Patient Instructions (Signed)
I value your feedback and you entrusting us with your care. If you get a [facility] patient survey, I would appreciate you taking the time to let us know about your experience today. Thank you! ? ? ?

## 2023-06-22 IMAGING — MR MR ABDOMEN W/O CM
7 of 10 series · 32 of 48 positions shown · non-contrast
Comparison: None.

CLINICAL DATA: Abdominal pain. Acute nonlocalized. Eighteen weeks
pregnant.

EXAM:
MRI ABDOMEN AND PELVIS WITHOUT CONTRAST
TECHNIQUE: Multiplanar multisequence MR imaging of the abdomen and pelvis was
performed. No intravenous contrast was administered.

[Series 4: cor haste · coronal · 5.0mm · 1.25mm/px · 3 of 44 slices shown]
[im 1/44]
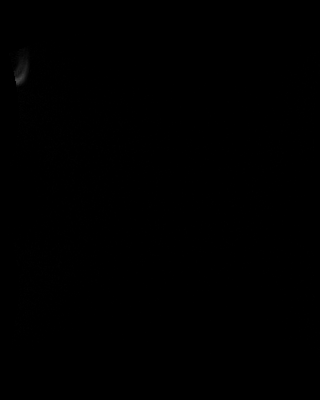
[im 22/44]
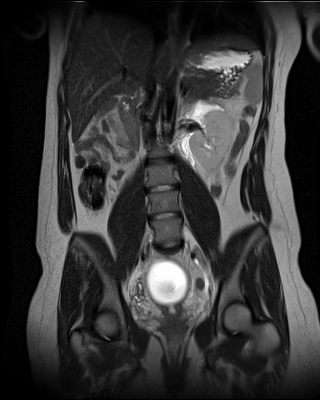
[im 44/44]
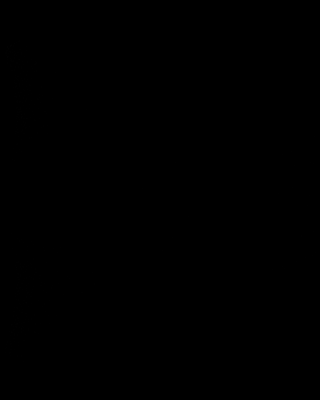

[Series 5: T2 · axial · 4.0mm · 1.00mm/px · z∈[-294,-24]mm · 4 of 55 slices shown]
[im 1/55]
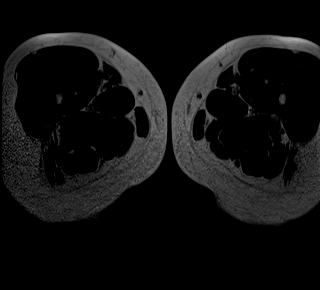
[im 19/55]
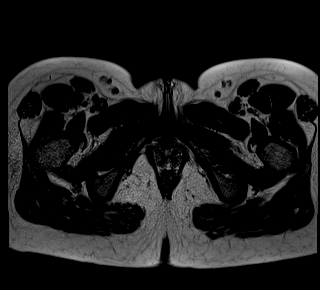
[im 37/55]
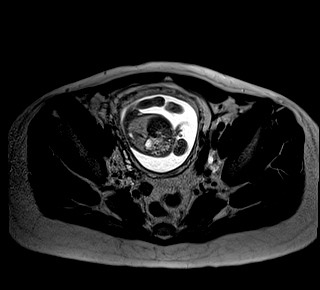
[im 55/55]
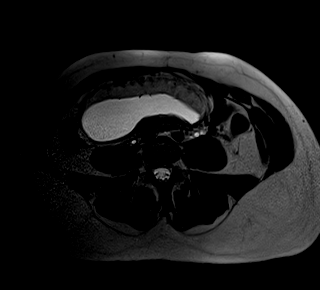

[Series 6: T2 fat-sat · axial · 4.0mm · 1.00mm/px · z∈[-294,-24]mm · 5 of 55 slices shown]
[im 1/55]
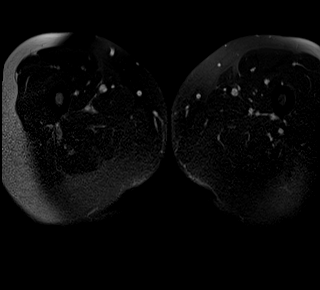
[im 14/55]
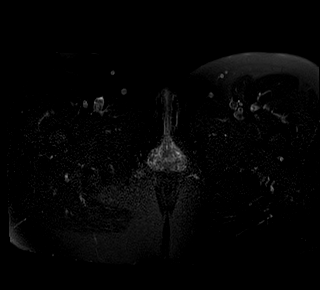
[im 28/55]
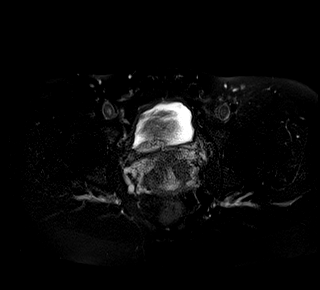
[im 41/55]
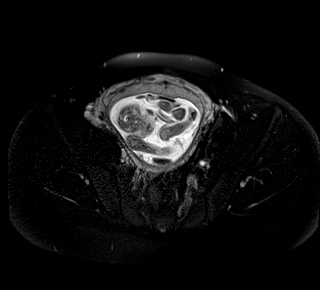
[im 55/55]
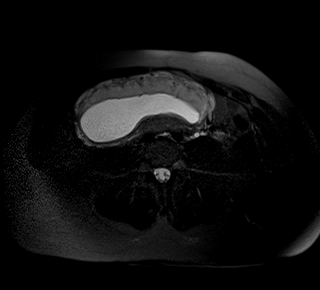

[Series 7: bSSFP · axial · 4.0mm · 0.62mm/px · z∈[-294,-24]mm · 5 of 55 slices shown (1 of 2)]
[im 1/55]
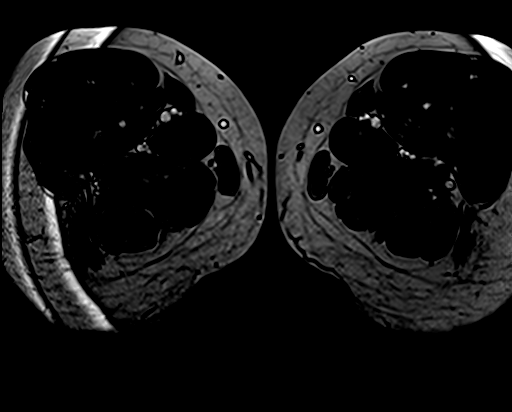
[im 14/55]
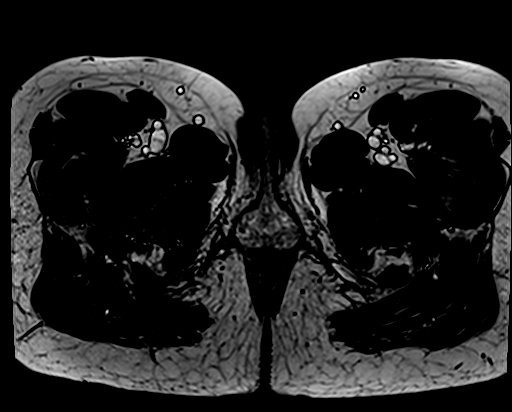
[im 28/55]
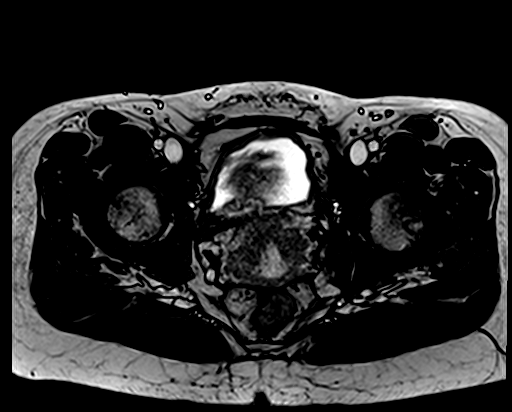
[im 41/55]
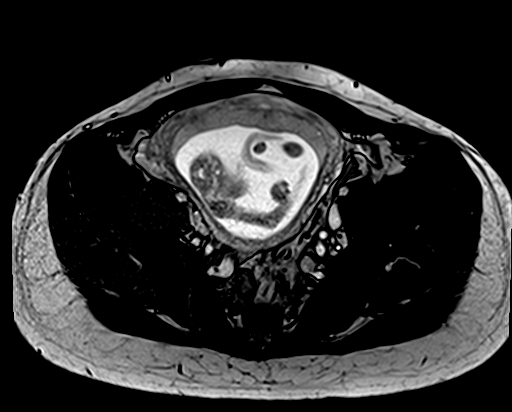
[im 55/55]
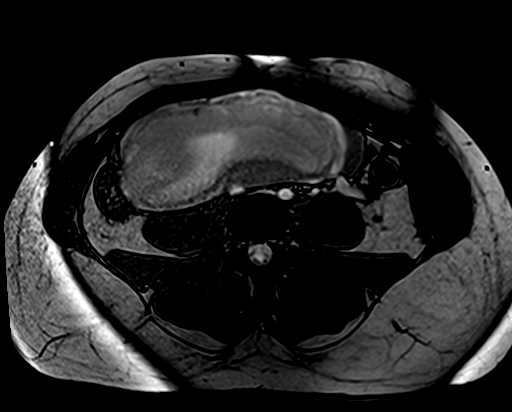

[Series 8: T1 dynamic fat-sat · axial · 3.0mm · 1.00mm/px · z∈[-251,+224]mm · 8 of 160 slices shown]
[im 1/160]
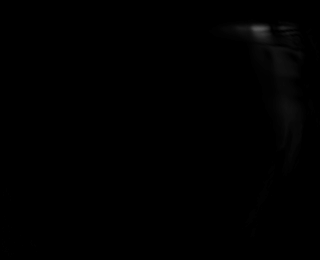
[im 25/160]
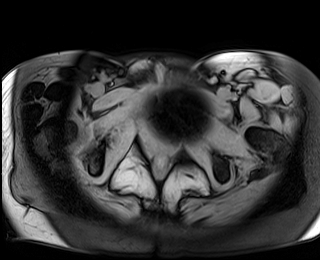
[im 49/160]
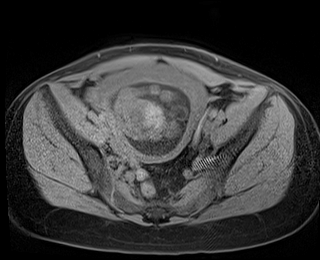
[im 74/160]
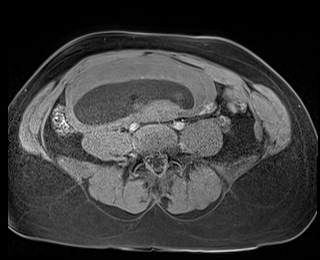
[im 86/160]
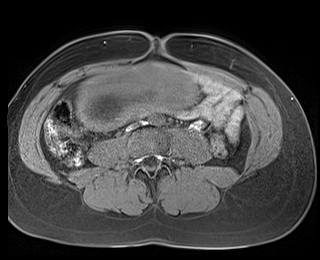
[im 111/160]
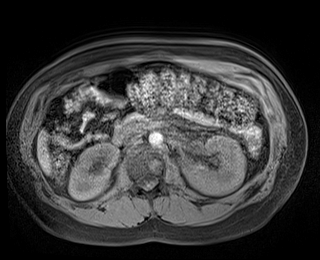
[im 135/160]
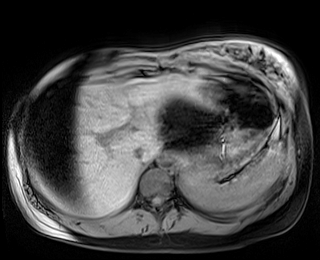
[im 160/160]
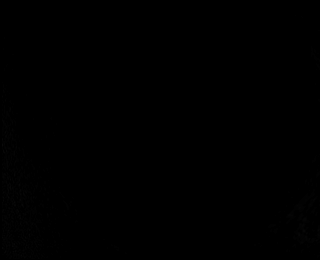

[Series 9: cor haste fs · coronal · 5.0mm · 1.25mm/px · 4 of 44 slices shown]
[im 1/44]
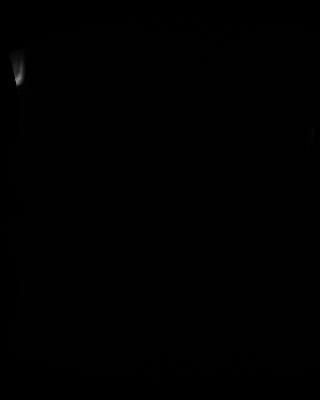
[im 15/44]
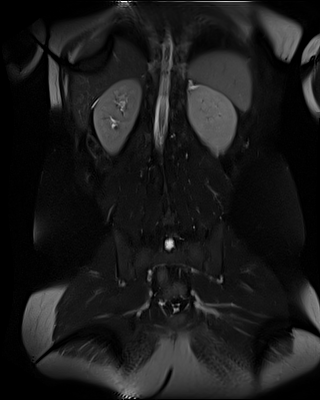
[im 29/44]
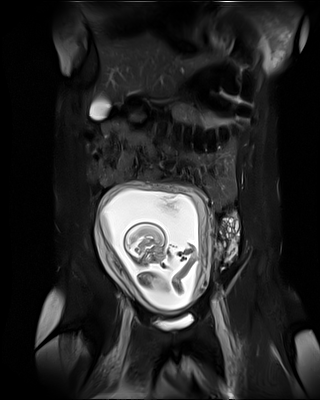
[im 44/44]
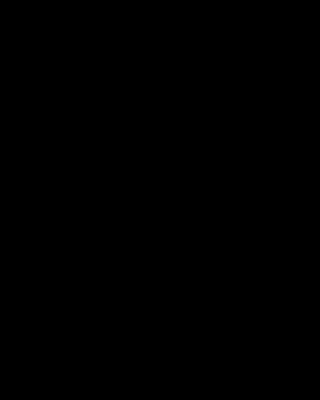

[Series 10: bSSFP · coronal · 5.0mm · 0.62mm/px · 3 of 44 slices shown (2 of 2)]
[im 1/44]
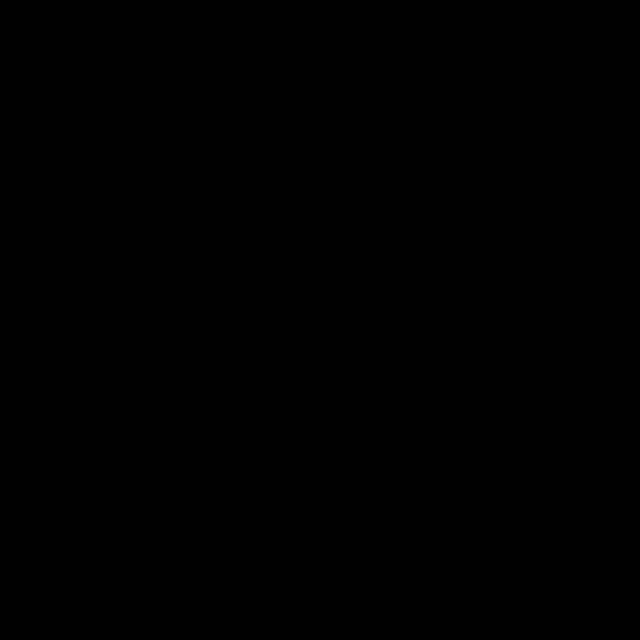
[im 15/44]
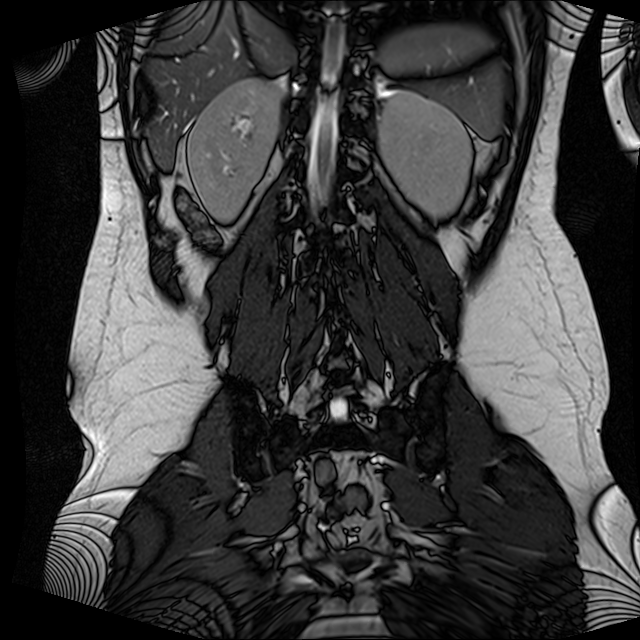
[im 29/44]
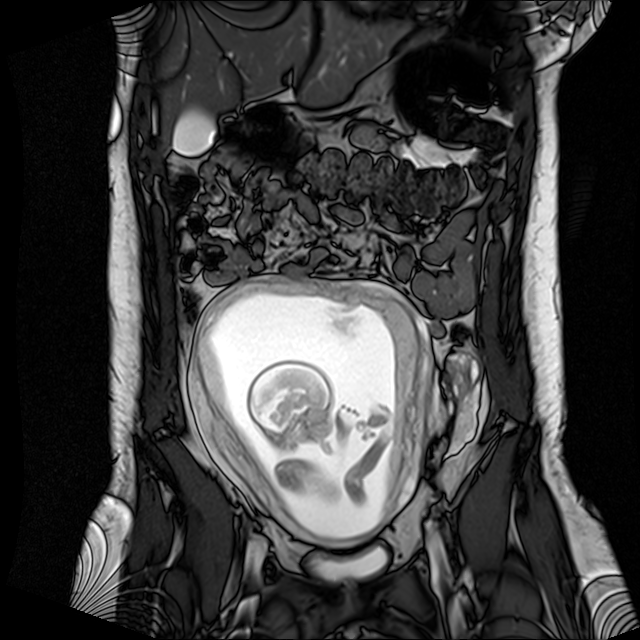

[32 of 48 positions shown; findings below may reference images not displayed]

FINDINGS: COMBINED FINDINGS FOR BOTH MR ABDOMEN AND PELVIS

Lower chest: Lung bases are clear.

Hepatobiliary: No focal hepatic lesion. No biliary duct dilatation.
Common bile duct is normal. Normal gallbladder. No gallstones
evident.

Pancreas: Pancreas is normal. No ductal dilatation. No pancreatic
inflammation.

Spleen: Normal spleen

Mild hydronephrosis on the LEFT and prominent LEFT ureter. No
obstructing calculus is identified in the LEFT ureter. There is mild
renal edema in the LEFT renal pelvis. (Image 16/series 11 and image
21/series 4).

Stomach/Bowel: Stomach, small-bowel and cecum normal. Appendix is
normal (image 22/series 4). Colon rectosigmoid colon normal.

Vascular/Lymphatic: Abdominal aorta is normal caliber. No periportal
or retroperitoneal adenopathy. No pelvic adenopathy.

Reproductive: Gravid uterus. LEFT ovary is normal measuring 2.7 by
1.7 cm (image [DATE]. RIGHT ovary is posterior to the uterus measuring
3.4 by 1.2 cm (image [DATE].

Other: Trace free fluid the pelvis

Musculoskeletal: No aggressive osseous lesion.
IMPRESSION: 1. Normal appendix.
2. Mild renal edema in the LEFT renal pelvis. Mild prominence of the
LEFT ureter. Mild pelvicaliectasis on the LEFT. No obstructing
calculus or lesion identified. Findings could represent the sequelae
of a passed stone or an occult stone.
3. Gravid uterus.
4. Normal ovaries.

## 2023-06-28 IMAGING — US US OB COMP +14 WK
1 series · 15 of 28 positions shown · non-contrast
Comparison: none

CLINICAL DATA: Second trimester pregnancy for fetal anatomy survey.

EXAM:
OBSTETRICAL ULTRASOUND >14 WKS

[Series 1: us ob comp + 14 wk · 15 of 75 slices shown]
[im 1/75]
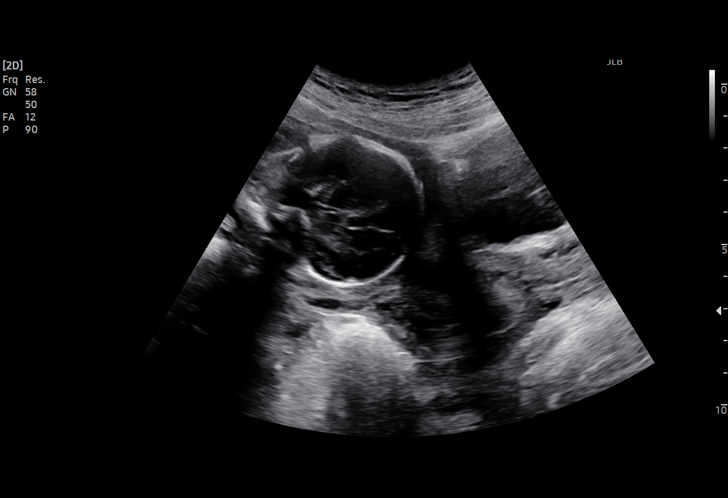
[im 6/75]
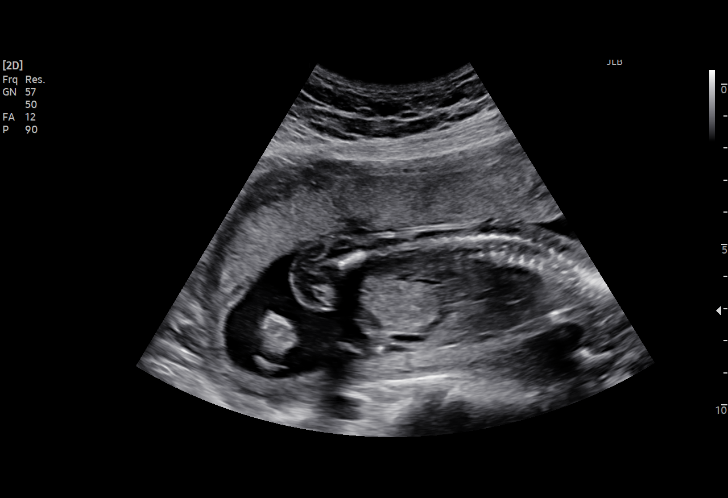
[im 11/75]
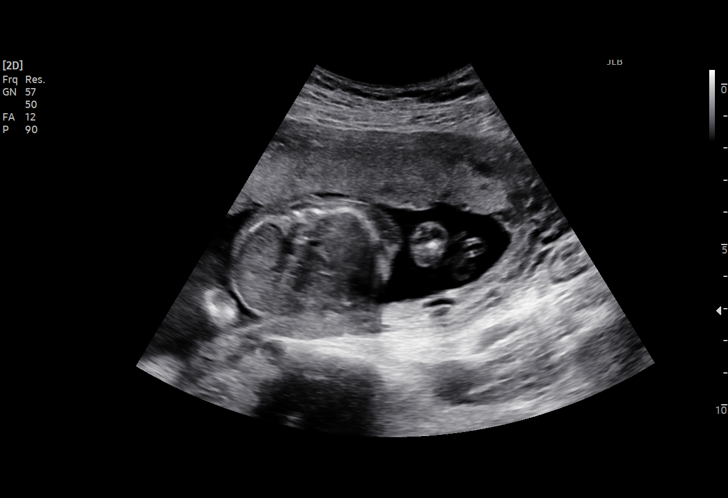
[im 17/75]
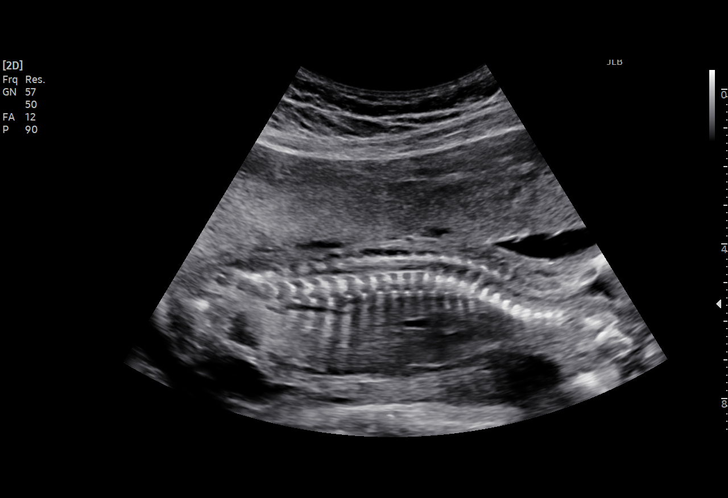
[im 22/75]
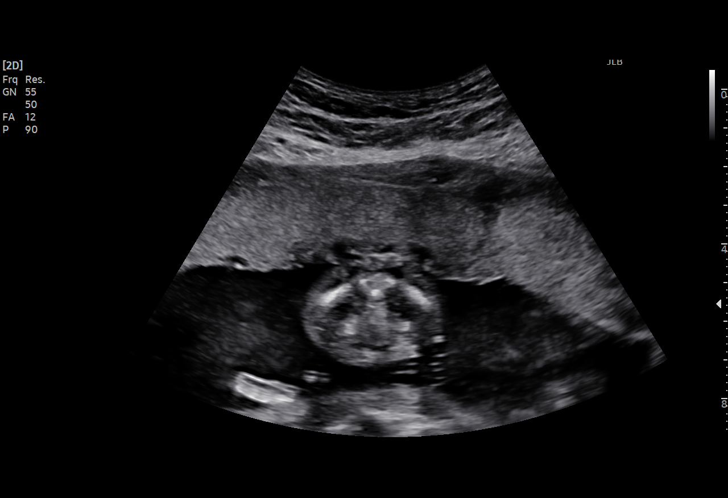
[im 28/75]
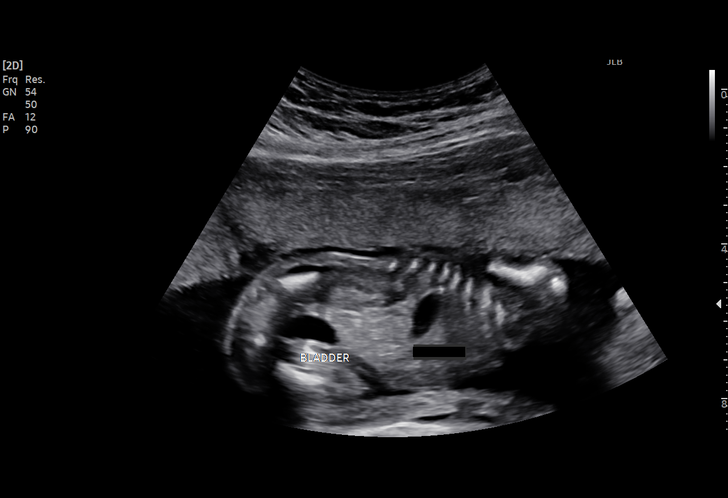
[im 33/75]
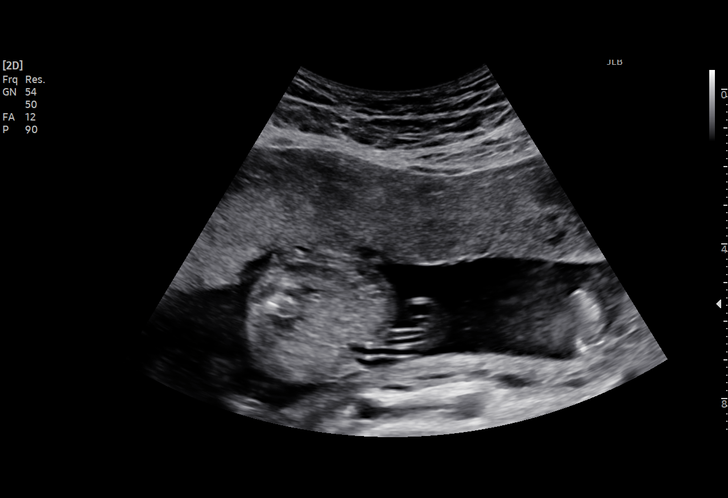
[im 39/75]
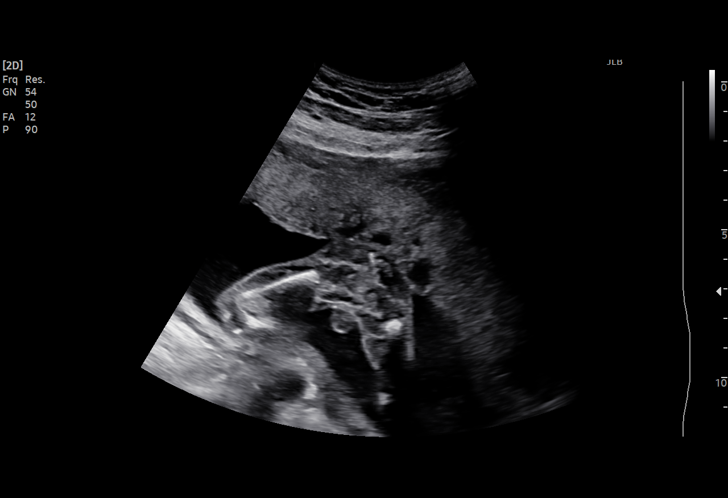
[im 42/75]
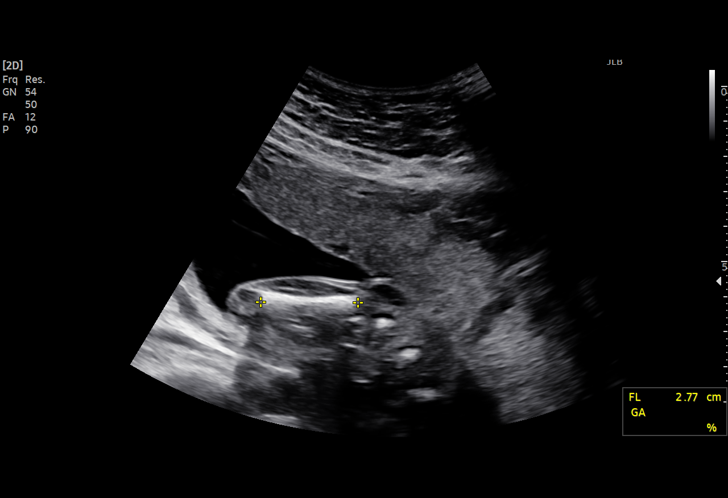
[im 47/75]
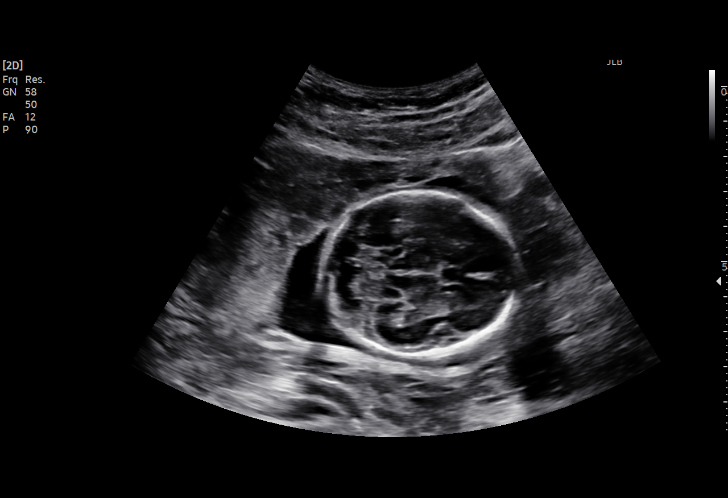
[im 53/75]
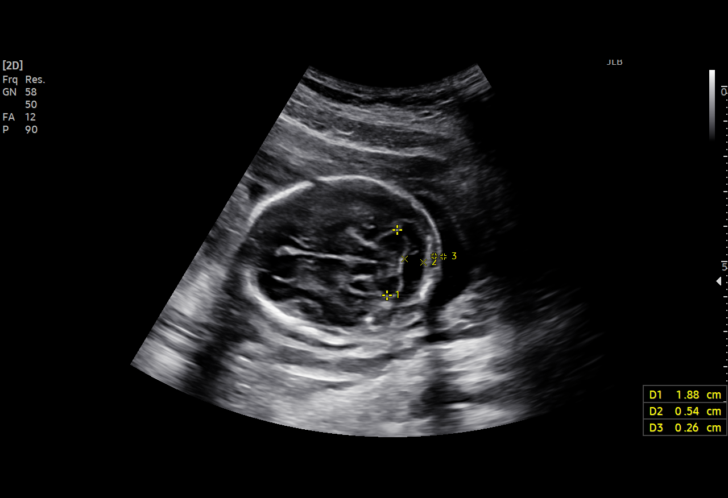
[im 58/75]
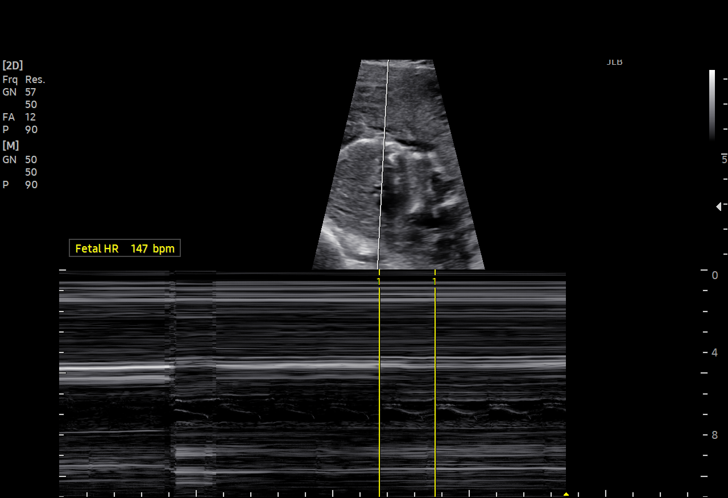
[im 64/75]
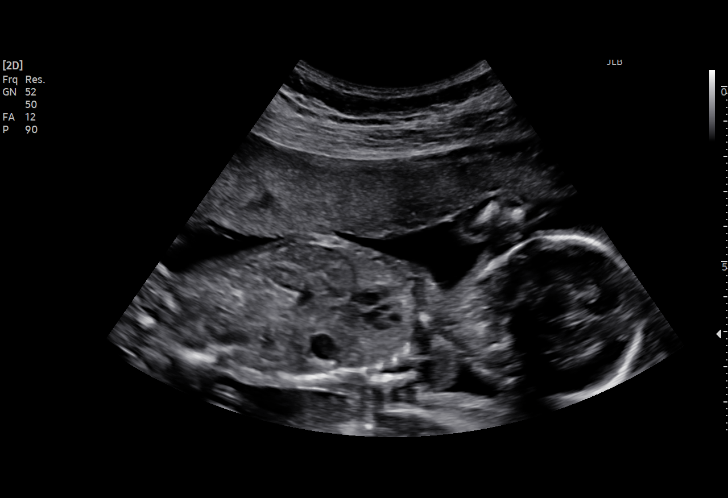
[im 69/75]
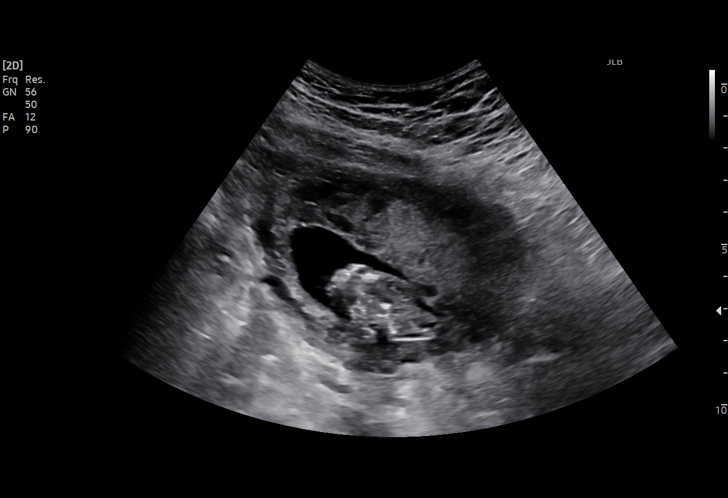
[im 75/75]
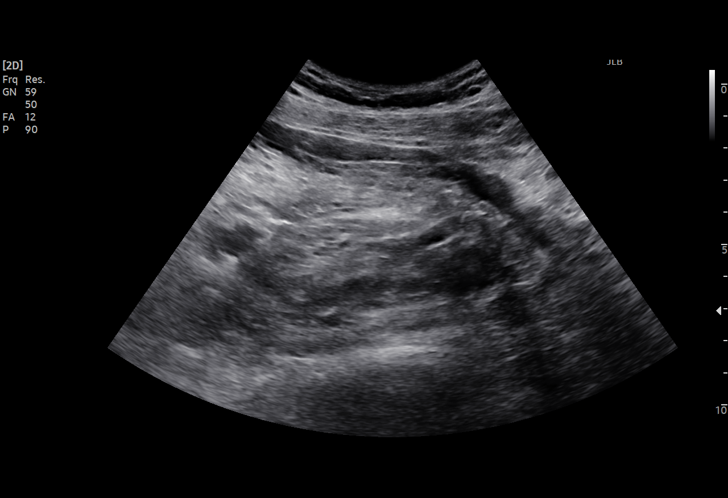

[15 of 28 positions shown; findings below may reference images not displayed]

FINDINGS: Number of Fetuses: 1

Heart Rate:  147 bpm

Movement: Yes

Presentation: Cephalic

Previa: No

Placental Location: Anterior

Amniotic Fluid (Subjective): Within normal limits

Amniotic Fluid (Objective):

Vertical pocket = 3.3cm

FETAL BIOMETRY

BPD: 4.6cm 19w 6d

HC:   16.9cm 19w 4d

AC:   14.0cm 19w 3d

FL:   2.8cm 18w 4d

Current Mean GA: 19w 2d US EDC: 06/22/2021

Assigned GA:  18w 6d Assigned EDC: 06/25/2021

FETAL ANATOMY

Lateral Ventricles: Appears normal

Thalami/CSP: Appears normal

Posterior Fossa:  Appears normal

Nuchal Region: Appears normal   NFT= 2.6 mm

Upper Lip: Appears normal

Spine: Appears normal

4 Chamber Heart on Left: Appears normal

LVOT: Appears normal

RVOT: Appears normal

Stomach on Left: Appears normal

3 Vessel Cord: Appears normal

Cord Insertion site: Appears normal

Kidneys: Appears normal

Bladder: Appears normal

Extremities: Appears normal

Sex: Male

Maternal Findings:

Cervix:  3.2 cm TA
IMPRESSION: Assigned GA currently 18 weeks 6 days.  Appropriate fetal growth.

Unremarkable fetal anatomic survey.  No fetal anomalies identified.

## 2023-11-21 ENCOUNTER — Other Ambulatory Visit: Payer: Self-pay | Admitting: Obstetrics and Gynecology

## 2023-11-21 ENCOUNTER — Emergency Department
Admission: EM | Admit: 2023-11-21 | Discharge: 2023-11-21 | Disposition: A | Discharge status: Home / Self Care | Attending: Emergency Medicine | Admitting: Emergency Medicine

## 2023-11-21 ENCOUNTER — Emergency Department

## 2023-11-21 ENCOUNTER — Other Ambulatory Visit: Payer: Self-pay

## 2023-11-21 DIAGNOSIS — R109 Unspecified abdominal pain: Secondary | ICD-10-CM

## 2023-11-21 DIAGNOSIS — J45909 Unspecified asthma, uncomplicated: Secondary | ICD-10-CM | POA: Diagnosis not present

## 2023-11-21 DIAGNOSIS — N23 Unspecified renal colic: Secondary | ICD-10-CM | POA: Diagnosis not present

## 2023-11-21 DIAGNOSIS — D72829 Elevated white blood cell count, unspecified: Secondary | ICD-10-CM | POA: Diagnosis not present

## 2023-11-21 DIAGNOSIS — Z3041 Encounter for surveillance of contraceptive pills: Secondary | ICD-10-CM

## 2023-11-21 LAB — URINALYSIS, ROUTINE W REFLEX MICROSCOPIC
Bacteria, UA: NONE SEEN
Bilirubin Urine: NEGATIVE
Glucose, UA: NEGATIVE mg/dL
Ketones, ur: NEGATIVE mg/dL
Leukocytes,Ua: NEGATIVE
Nitrite: NEGATIVE
Protein, ur: 30 mg/dL — AB
RBC / HPF: >50 RBC/hpf (ref 0–5)
Specific Gravity, Urine: 1.029 (ref 1.005–1.030)
pH: 5 (ref 5.0–8.0)

## 2023-11-21 LAB — COMPREHENSIVE METABOLIC PANEL WITH GFR
ALT: 16 U/L (ref 0–44)
AST: 28 U/L (ref 15–41)
Albumin: 3.7 g/dL (ref 3.5–5.0)
Alkaline Phosphatase: 51 U/L (ref 38–126)
Anion gap: 10 (ref 5–15)
BUN: 13 mg/dL (ref 6–20)
CO2: 23 mmol/L (ref 22–32)
Calcium: 9.4 mg/dL (ref 8.9–10.3)
Chloride: 105 mmol/L (ref 98–111)
Creatinine, Ser: 0.76 mg/dL (ref 0.44–1.00)
GFR, Estimated: >60 mL/min (ref >60)
Glucose, Bld: 103 mg/dL — ABNORMAL HIGH (ref 70–99)
Potassium: 4.1 mmol/L (ref 3.5–5.1)
Sodium: 138 mmol/L (ref 135–145)
Total Bilirubin: 0.6 mg/dL (ref 0.0–1.2)
Total Protein: 6.6 g/dL (ref 6.5–8.1)

## 2023-11-21 LAB — CBC WITH DIFFERENTIAL/PLATELET
Abs Immature Granulocytes: 0.07 10*3/uL (ref 0.00–0.07)
Basophils Absolute: 0.1 10*3/uL (ref 0.0–0.1)
Basophils Relative: 1 %
Eosinophils Absolute: 0.2 10*3/uL (ref 0.0–0.5)
Eosinophils Relative: 2 %
HCT: 39.8 % (ref 36.0–46.0)
Hemoglobin: 13.5 g/dL (ref 12.0–15.0)
Immature Granulocytes: 1 %
Lymphocytes Relative: 39 %
Lymphs Abs: 4.3 10*3/uL — ABNORMAL HIGH (ref 0.7–4.0)
MCH: 30.8 pg (ref 26.0–34.0)
MCHC: 33.9 g/dL (ref 30.0–36.0)
MCV: 90.9 fL (ref 80.0–100.0)
Monocytes Absolute: 0.8 10*3/uL (ref 0.1–1.0)
Monocytes Relative: 7 %
Neutro Abs: 5.7 10*3/uL (ref 1.7–7.7)
Neutrophils Relative %: 50 %
Platelets: 193 10*3/uL (ref 150–400)
RBC: 4.38 MIL/uL (ref 3.87–5.11)
RDW: 12.4 % (ref 11.5–15.5)
WBC: 11.1 10*3/uL — ABNORMAL HIGH (ref 4.0–10.5)
nRBC: 0 % (ref 0.0–0.2)

## 2023-11-21 LAB — POC URINE PREG, ED: Preg Test, Ur: NEGATIVE

## 2023-11-21 MED ORDER — OXYCODONE-ACETAMINOPHEN 5-325 MG PO TABS: 1.0000 | ORAL_TABLET | ORAL | 0 refills | Status: DC | PRN

## 2023-11-21 MED ORDER — ONDANSETRON HCL 4 MG/2ML IJ SOLN
4.0000 mg | Freq: Once | INTRAMUSCULAR | Status: AC
  Administered 2023-11-21: 4 mg via INTRAVENOUS
  Filled 2023-11-21: qty 2

## 2023-11-21 MED ORDER — OXYCODONE-ACETAMINOPHEN 5-325 MG PO TABS
1.0000 | ORAL_TABLET | Freq: Once | ORAL | Status: AC
  Administered 2023-11-21: 1 via ORAL
  Filled 2023-11-21: qty 1

## 2023-11-21 MED ORDER — KETOROLAC TROMETHAMINE 30 MG/ML IJ SOLN
15.0000 mg | Freq: Once | INTRAMUSCULAR | Status: AC
  Administered 2023-11-21: 15 mg via INTRAVENOUS
  Filled 2023-11-21: qty 1

## 2023-11-21 MED ORDER — TAMSULOSIN HCL 0.4 MG PO CAPS
0.4000 mg | ORAL_CAPSULE | Freq: Once | ORAL | Status: AC
  Administered 2023-11-21: 0.4 mg via ORAL
  Filled 2023-11-21: qty 1

## 2023-11-21 MED ORDER — HYDROMORPHONE HCL 1 MG/ML IJ SOLN
0.5000 mg | Freq: Once | INTRAMUSCULAR | Status: AC
  Administered 2023-11-21: 0.5 mg via INTRAVENOUS
  Filled 2023-11-21: qty 0.5

## 2023-11-21 MED ORDER — ONDANSETRON 4 MG PO TBDP: 4.0000 mg | ORAL_TABLET | Freq: Three times a day (TID) | ORAL | 0 refills | Status: DC | PRN

## 2023-11-21 MED ORDER — SODIUM CHLORIDE 0.9 % IV BOLUS
1000.0000 mL | Freq: Once | INTRAVENOUS | Status: AC
  Administered 2023-11-21: 1000 mL via INTRAVENOUS

## 2023-11-21 MED ORDER — TAMSULOSIN HCL 0.4 MG PO CAPS: 0.4000 mg | ORAL_CAPSULE | Freq: Every day | ORAL | 0 refills | Status: DC

## 2023-11-21 NOTE — ED Notes (Signed)
 Pt taken to rm 17. Pt hooked up to pulse ox and blood pressure cuff. Bed locked in lowest position and call light within reach.

## 2023-11-21 NOTE — ED Provider Notes (Signed)
 [facility] Provider Note    Event Date/Time   First MD Initiated Contact with Patient 11/21/23 [phone]     (approximate)   History   Flank Pain   HPI  Christine Mcbride is a 28 y.o. female who presents to the ED from home with a chief complaint of urinary frequency and right flank pain which began this morning.  History of kidney stones.  Endorses associated nausea without vomiting.  Denies fever/chills, chest pain, shortness of breath, abdominal pain, hematuria.     Past Medical History   Past Medical History:  Diagnosis Date   Anemia    Asthma    H/O dysmenorrhea    Hx of migraines    Irregular menstrual cycle      Active Problem List   Patient Active Problem List   Diagnosis Date Noted   Post-dates pregnancy 06/29/2021   Obesity (BMI 30.0-34.9) 05/26/2021   Rh negative state in antepartum period 01/12/2021   Gastroesophageal reflux in pregnancy 01/12/2021   Hx of migraine headaches 01/12/2021     Past Surgical History   Past Surgical History:  Procedure Laterality Date   WISDOM TOOTH EXTRACTION       Home Medications   Prior to Admission medications   Medication Sig Start Date End Date Taking? Authorizing Provider  ondansetron  (ZOFRAN -ODT) 4 MG disintegrating tablet Take 1 tablet (4 mg total) by mouth every 8 (eight) hours as needed for nausea or vomiting. 11/21/23  Yes Robinette Vermell PARAS, MD  oxyCODONE -acetaminophen  (PERCOCET/ROXICET) 5-325 MG tablet Take 1 tablet by mouth every 4 (four) hours as needed for severe pain (pain score 7-10). 11/21/23  Yes Talor Desrosiers J, MD  tamsulosin (FLOMAX) 0.4 MG CAPS capsule Take 1 capsule (0.4 mg total) by mouth daily. 11/21/23  Yes Loreta Blouch J, MD  norethindrone  (INCASSIA ) 0.35 MG tablet Take 1 tablet (0.35 mg total) by mouth daily. 12/22/22   Copland, Alicia B, PA-C     Allergies  Patient has no known allergies.   Family History   Family History  Problem Relation Age of Onset   Other Mother         cervical dysplasia   Skin cancer Mother    Diabetes Father    Diabetes Maternal Grandmother    Hypertension Maternal Grandfather    Diabetes Paternal Grandmother    Heart attack Paternal Grandmother      Physical Exam  Triage Vital Signs: ED Triage Vitals  Encounter Vitals Group     BP 11/21/23 0032 (!) 131/91     Girls Systolic BP Percentile --      Girls Diastolic BP Percentile --      Boys Systolic BP Percentile --      Boys Diastolic BP Percentile --      Pulse Rate 11/21/23 0032 100     Resp 11/21/23 0032 18     Temp 11/21/23 0032 98.6 F (37 C)     Temp src --      SpO2 11/21/23 0032 100 %     Weight --      Height --      Head Circumference --      Peak Flow --      Pain Score 11/21/23 0031 10     Pain Loc --      Pain Education --      Exclude from Growth Chart --     Updated Vital Signs: BP 111/70   Pulse 75  Temp 98.2 F (36.8 C) (Oral)   Resp 16   SpO2 96%    General: Awake, mild distress.  CV:  RRR.  Good peripheral perfusion.  Resp:  Normal effort.  CTAB. Abd:  Nontender.  Mild right CVAT.  No distention.  Other:  No truncal vesicles.   ED Results / Procedures / Treatments  Labs (all labs ordered are listed, but only abnormal results are displayed) Labs Reviewed  URINALYSIS, ROUTINE W REFLEX MICROSCOPIC - Abnormal; Notable for the following components:      Result Value   Color, Urine YELLOW (*)    APPearance HAZY (*)    Hgb urine dipstick LARGE (*)    Protein, ur 30 (*)    All other components within normal limits  CBC WITH DIFFERENTIAL/PLATELET - Abnormal; Notable for the following components:   WBC 11.1 (*)    Lymphs Abs 4.3 (*)    All other components within normal limits  COMPREHENSIVE METABOLIC PANEL WITH GFR - Abnormal; Notable for the following components:   Glucose, Bld 103 (*)    All other components within normal limits  POC URINE PREG, ED     EKG  None   RADIOLOGY I have independently visualized interpreted  patient's imaging study as well as noted the radiology interpretation:  CT Renal Stone: 4mm distal right ureteral stone w/ obstructive change  Official radiology report(s): CT Renal Stone Study Result Date: 11/21/2023 CLINICAL DATA:  Right-sided flank pain for several hours EXAM: CT ABDOMEN AND PELVIS WITHOUT CONTRAST TECHNIQUE: Multidetector CT imaging of the abdomen and pelvis was performed following the standard protocol without IV contrast. RADIATION DOSE REDUCTION: This exam was performed according to the departmental dose-optimization program which includes automated exposure control, adjustment of the mA and/or kV according to patient size and/or use of iterative reconstruction technique. COMPARISON:  None Available. FINDINGS: Lower chest: No acute abnormality. Hepatobiliary: No focal liver abnormality is seen. No gallstones, gallbladder wall thickening, or biliary dilatation. Pancreas: Unremarkable. No pancreatic ductal dilatation or surrounding inflammatory changes. Spleen: Normal in size without focal abnormality. Adrenals/Urinary Tract: Adrenal glands are within normal limits. Kidneys are well visualize with punctate nonobstructing stones bilaterally. Additionally, there is fullness of the right renal collecting system and right ureter secondary to a distal 4 mm stone best seen on image number 78 of series 2. Bladder is decompressed. Stomach/Bowel: Appendix is well visualized and within normal limits. No obstructive or inflammatory changes of the colon are seen. Small bowel and stomach are within normal limits. Vascular/Lymphatic: No significant vascular findings are present. No enlarged abdominal or pelvic lymph nodes. Reproductive: Uterus and bilateral adnexa are unremarkable. Other: No abdominal wall hernia or abnormality. No abdominopelvic ascites. Musculoskeletal: No acute or significant osseous findings. IMPRESSION: 4 mm distal right ureteral stone with obstructive change. Punctate  nonobstructing stones bilaterally. Electronically Signed   By: Oneil Devonshire M.D.   On: 11/21/2023 01:24     PROCEDURES:  Critical Care performed: No  Procedures   MEDICATIONS ORDERED IN ED: Medications  sodium chloride  0.9 % bolus 1,000 mL (0 mLs Intravenous Stopped 11/21/23 0231)  ondansetron  (ZOFRAN ) injection 4 mg (4 mg Intravenous Given 11/21/23 0125)  ketorolac (TORADOL) 30 MG/ML injection 15 mg (15 mg Intravenous Given 11/21/23 0126)  HYDROmorphone (DILAUDID) injection 0.5 mg (0.5 mg Intravenous Given 11/21/23 0126)  oxyCODONE -acetaminophen  (PERCOCET/ROXICET) 5-325 MG per tablet 1 tablet (1 tablet Oral Given 11/21/23 0325)  tamsulosin (FLOMAX) capsule 0.4 mg (0.4 mg Oral Given 11/21/23 0325)  IMPRESSION / MDM / ASSESSMENT AND PLAN / ED COURSE  I reviewed the triage vital signs and the nursing notes.                             28 year old female presenting with right flank pain, history of kidney stones. Differential diagnosis includes, but is not limited to, ovarian cyst, ovarian torsion, acute appendicitis, diverticulitis, urinary tract infection/pyelonephritis, endometriosis, bowel obstruction, colitis, renal colic, gastroenteritis, hernia, fibroids, endometriosis, pregnancy related pain including ectopic pregnancy, etc. I personally reviewed patient's records and note GYN office visit from 12/22/2022 for follow-up annual GYN exam.  Patient's presentation is most consistent with acute complicated illness / injury requiring diagnostic workup.  Laboratories also unremarkable.  Awaiting UA.  CT demonstrates 4 mm stone.  Administer IV fluids, ketorolac, Dilaudid, Zofran  and reassess.  Clinical Course as of 11/21/23 0539  Tue Nov 21, 2023  0159 Pain improved.  Initial urine specimen not enough to run UA.  Patient aware to provide additional specimen. [JS]  0315 UA negative for infection.  Will discharge home with as needed pain and nausea medicines and follow-up with urology.  Strict  return precautions given.  Patient and family member verbalized understanding and agreed with plan of care. [JS]    Clinical Course User Index [JS] Robinette Vermell PARAS, MD     FINAL CLINICAL IMPRESSION(S) / ED DIAGNOSES   Final diagnoses:  Right flank pain  Ureteral colic     Rx / DC Orders   ED Discharge Orders          Ordered    tamsulosin (FLOMAX) 0.4 MG CAPS capsule  Daily        11/21/23 0323    oxyCODONE -acetaminophen  (PERCOCET/ROXICET) 5-325 MG tablet  Every 4 hours PRN        11/21/23 0323    ondansetron  (ZOFRAN -ODT) 4 MG disintegrating tablet  Every 8 hours PRN        11/21/23 0323             Note:  This document was prepared using Dragon voice recognition software and may include unintentional dictation errors.   Kealie Barrie J, MD 11/21/23 [phone]

## 2023-11-21 NOTE — Discharge Instructions (Addendum)
 1. Take pain & nausea medicines as needed (Percocet/Zofran ). Make sure to take a stool softener while taking narcotic pain medicines. 2. Take Flomax 0.4mg  daily x 14 days. 3. Drink plenty of bottled or filtered water daily. 4. Return to the ER for worsening symptoms, persistent vomiting, fever, difficulty breathing or other concerns.

## 2023-11-21 NOTE — ED Triage Notes (Signed)
 Pt reports urinary frequency and right flank pain that began this morning.

## 2023-11-21 NOTE — ED Notes (Signed)
 Lab called, not enough urine in sample sent to lab. Pt made aware of need for urine sample w/ verbalization of understanding.

## 2023-11-22 ENCOUNTER — Other Ambulatory Visit: Payer: Self-pay

## 2023-11-22 ENCOUNTER — Emergency Department
Admission: EM | Admit: 2023-11-22 | Discharge: 2023-11-22 | Disposition: A | Discharge status: Home / Self Care | Attending: Emergency Medicine | Admitting: Emergency Medicine

## 2023-11-22 ENCOUNTER — Encounter: Payer: Self-pay | Admitting: Emergency Medicine

## 2023-11-22 DIAGNOSIS — J45909 Unspecified asthma, uncomplicated: Secondary | ICD-10-CM | POA: Diagnosis not present

## 2023-11-22 DIAGNOSIS — R112 Nausea with vomiting, unspecified: Secondary | ICD-10-CM

## 2023-11-22 DIAGNOSIS — R109 Unspecified abdominal pain: Secondary | ICD-10-CM | POA: Diagnosis present

## 2023-11-22 DIAGNOSIS — N201 Calculus of ureter: Secondary | ICD-10-CM | POA: Insufficient documentation

## 2023-11-22 MED ORDER — PANTOPRAZOLE SODIUM 40 MG IV SOLR
40.0000 mg | Freq: Once | INTRAVENOUS | Status: AC
  Administered 2023-11-22: 40 mg via INTRAVENOUS
  Filled 2023-11-22: qty 10

## 2023-11-22 MED ORDER — KETOROLAC TROMETHAMINE 10 MG PO TABS: 10.0000 mg | ORAL_TABLET | Freq: Four times a day (QID) | ORAL | 0 refills | Status: AC | PRN

## 2023-11-22 MED ORDER — METOCLOPRAMIDE HCL 5 MG/ML IJ SOLN
10.0000 mg | INTRAMUSCULAR | Status: AC
  Administered 2023-11-22: 10 mg via INTRAVENOUS
  Filled 2023-11-22: qty 2

## 2023-11-22 MED ORDER — SODIUM CHLORIDE 0.9 % IV BOLUS
1000.0000 mL | Freq: Once | INTRAVENOUS | Status: AC
  Administered 2023-11-22: 1000 mL via INTRAVENOUS

## 2023-11-22 MED ORDER — DEXTROSE 5 % IN LACTATED RINGERS IV BOLUS
1000.0000 mL | Freq: Once | INTRAVENOUS | Status: AC
  Administered 2023-11-22: 1000 mL via INTRAVENOUS
  Filled 2023-11-22: qty 1000

## 2023-11-22 MED ORDER — KETOROLAC TROMETHAMINE 15 MG/ML IJ SOLN
15.0000 mg | Freq: Once | INTRAMUSCULAR | Status: AC
  Administered 2023-11-22: 15 mg via INTRAVENOUS
  Filled 2023-11-22: qty 1

## 2023-11-22 MED ORDER — ONDANSETRON HCL 4 MG/2ML IJ SOLN
4.0000 mg | Freq: Once | INTRAMUSCULAR | Status: AC
  Administered 2023-11-22: 4 mg via INTRAVENOUS
  Filled 2023-11-22: qty 2

## 2023-11-22 MED ORDER — METOCLOPRAMIDE HCL 10 MG PO TABS: 10.0000 mg | ORAL_TABLET | Freq: Four times a day (QID) | ORAL | 0 refills | Status: DC | PRN

## 2023-11-22 NOTE — ED Notes (Signed)
Meds given for pain

## 2023-11-22 NOTE — ED Notes (Signed)
Pt drinking ginger ale  

## 2023-11-22 NOTE — ED Triage Notes (Signed)
 Patient to ED via POV for right side flank pain. Seen for same on 7/1. Sent home with prescriptions- taking as prescribed but still vomiting. States she isn't able to eat or have a BM.

## 2023-11-22 NOTE — ED Provider Notes (Signed)
 [facility] Provider Note    Event Date/Time   First MD Initiated Contact with Patient 11/22/23 1438     (approximate)   History   Chief Complaint: Flank Pain   HPI  Christine Mcbride is a 28 y.o. female who comes ED complaining of right flank pain.  She was seen in the ED yesterday for same, diagnosed with 4 mm distal right ureteral stone.  Labs were okay.  Felt better after Toradol and oxycodone  in the emergency department, discharged home.  When taking the oxycodone  at home, she gets nauseated and vomits.  She has not been able to eat since yesterday due to nausea.  Because of this she is reluctant to take the pain medication, and is also having persistent pain.        Past Medical History:  Diagnosis Date   Anemia    Asthma    H/O dysmenorrhea    Hx of migraines    Irregular menstrual cycle     Current Outpatient Rx   Order #: [id number] Class: Normal   Order #: [id number] Class: Normal   Order #: [id number] Class: Normal   Order #: [id number] Class: Print    Past Surgical History:  Procedure Laterality Date   WISDOM TOOTH EXTRACTION      Physical Exam   Triage Vital Signs: ED Triage Vitals  Encounter Vitals Group     BP 11/22/23 1420 (!) 142/82     Girls Systolic BP Percentile --      Girls Diastolic BP Percentile --      Boys Systolic BP Percentile --      Boys Diastolic BP Percentile --      Pulse Rate 11/22/23 1420 (!) 115     Resp 11/22/23 1420 17     Temp 11/22/23 1420 98.7 F (37.1 C)     Temp Source 11/22/23 1420 Oral     SpO2 11/22/23 1420 98 %     Weight 11/22/23 1419 151 lb 0.2 oz (68.5 kg)     Height 11/22/23 1419 5' 1 (1.549 m)     Head Circumference --      Peak Flow --      Pain Score 11/22/23 1419 6     Pain Loc --      Pain Education --      Exclude from Growth Chart --     Most recent vital signs: Vitals:   11/22/23 1420  BP: (!) 142/82  Pulse: (!) 115  Resp: 17  Temp: 98.7 F (37.1 C)  SpO2: 98%     General: Awake, no distress.  CV:  Good peripheral perfusion.  Resp:  Normal effort.  Abd:  No distention.  Soft nontender Other:  Moist oral mucosa   ED Results / Procedures / Treatments   Labs (all labs ordered are listed, but only abnormal results are displayed) Labs Reviewed - No data to display   EKG    RADIOLOGY CT abdomen pelvis from yesterday reviewed   PROCEDURES:  Procedures   MEDICATIONS ORDERED IN ED: Medications  sodium chloride  0.9 % bolus 1,000 mL (0 mLs Intravenous Stopped 11/22/23 1623)  ondansetron  (ZOFRAN ) injection 4 mg (4 mg Intravenous Given 11/22/23 1503)  ketorolac (TORADOL) 15 MG/ML injection 15 mg (15 mg Intravenous Given 11/22/23 1502)  dextrose 5% lactated ringers  bolus 1,000 mL (0 mLs Intravenous Stopped 11/22/23 1835)  metoCLOPramide (REGLAN) injection 10 mg (10 mg Intravenous Given 11/22/23 1747)  pantoprazole (PROTONIX) injection 40 mg (40 mg Intravenous  Given 11/22/23 1747)     IMPRESSION / MDM / ASSESSMENT AND PLAN / ED COURSE  I reviewed the triage vital signs and the nursing notes.    Patient's presentation is most consistent with acute presentation with potential threat to life or bodily function.  Patient presents with persistent pain related to ureterolithiasis diagnosed yesterday.  Labs yesterday were all unremarkable, she is nontoxic.  Mildly dehydrated from poor oral intake.  Will give IV fluids, Toradol Zofran .   Clinical Course as of 11/22/23 1842  Wed Nov 22, 2023  1839 Feeling better after reglan, tolerating PO. Stable for DC.  [PS]    Clinical Course User Index [PS] Viviann Pastor, MD     FINAL CLINICAL IMPRESSION(S) / ED DIAGNOSES   Final diagnoses:  Ureterolithiasis  Nausea and vomiting, unspecified vomiting type     Rx / DC Orders   ED Discharge Orders          Ordered    metoCLOPramide (REGLAN) 10 MG tablet  Every 6 hours PRN        11/22/23 1840    ketorolac (TORADOL) 10 MG tablet  Every 6 hours  PRN       Note to Pharmacy: Continuation of parenteral therapy   11/22/23 1840             Note:  This document was prepared using Dragon voice recognition software and may include unintentional dictation errors.   Viviann Pastor, MD 11/22/23 [phone]

## 2023-12-28 ENCOUNTER — Other Ambulatory Visit: Payer: Self-pay | Admitting: Obstetrics and Gynecology

## 2023-12-28 DIAGNOSIS — Z3041 Encounter for surveillance of contraceptive pills: Secondary | ICD-10-CM

## 2023-12-30 ENCOUNTER — Other Ambulatory Visit: Payer: Self-pay | Admitting: Obstetrics and Gynecology

## 2023-12-30 DIAGNOSIS — Z3041 Encounter for surveillance of contraceptive pills: Secondary | ICD-10-CM

## 2024-01-02 ENCOUNTER — Other Ambulatory Visit: Payer: Self-pay

## 2024-01-02 DIAGNOSIS — Z3041 Encounter for surveillance of contraceptive pills: Secondary | ICD-10-CM

## 2024-01-02 MED ORDER — NORETHINDRONE 0.35 MG PO TABS: 1.0000 | ORAL_TABLET | Freq: Every day | ORAL | 0 refills | Status: DC

## 2024-01-16 ENCOUNTER — Ambulatory Visit: Admitting: Family Medicine

## 2024-01-16 VITALS — BP 113/73 | HR 76 | Temp 98.4°F | Resp 16 | Ht 61.0 in | Wt 151.0 lb

## 2024-01-16 DIAGNOSIS — F902 Attention-deficit hyperactivity disorder, combined type: Secondary | ICD-10-CM | POA: Diagnosis not present

## 2024-01-16 DIAGNOSIS — Z8669 Personal history of other diseases of the nervous system and sense organs: Secondary | ICD-10-CM | POA: Diagnosis not present

## 2024-01-16 MED ORDER — LISDEXAMFETAMINE DIMESYLATE 20 MG PO CAPS: 20.0000 mg | ORAL_CAPSULE | Freq: Every day | ORAL | 0 refills | Status: DC

## 2024-01-16 NOTE — Patient Instructions (Addendum)
 Welcome to [facility]! It was a pleasure meeting you today.  As discussed, Please schedule a 1 month follow up visit today.  Check insurance on coverage for ADD testing.   Start with 1 tab in the am for 3 days, if no change, take 2 at same time.   PLEASE NOTE:  If you had any LAB tests please let us  know if you have not heard back within a few days. You may see your results on MyChart before we have a chance to review them but we will give you a call once they are reviewed by us . If we ordered any REFERRALS today, please let us  know if you have not heard from their office within the next week.  Let us  know through MyChart if you are needing REFILLS, or have your pharmacy send us  the request. You can also use MyChart to communicate with me or any office staff.  Please try these tips to maintain a healthy lifestyle:  Eat most of your calories during the day when you are active. Eliminate processed foods including packaged sweets (pies, cakes, cookies), reduce intake of potatoes, white bread, white pasta, and white rice. Look for whole grain options, oat flour or almond flour.  Each meal should contain half fruits/vegetables, one quarter protein, and one quarter carbs (no bigger than a computer mouse).  Cut down on sweet beverages. This includes juice, soda, and sweet tea. Also watch fruit intake, though this is a healthier sweet option, it still contains natural sugar! Limit to 3 servings daily.  Drink at least 1 glass of water with each meal and aim for at least 8 glasses per day  Exercise at least 150 minutes every week.

## 2024-01-16 NOTE — Progress Notes (Signed)
 "  New Patient Office Visit  Subjective:  Patient ID: Christine Mcbride, Mcbride    DOB: Jun 14, 1995  Age: 28 y.o. MRN: [id number]  CC:  Chief Complaint  Patient presents with   Establish Care    Initial visit to establish care with new pcp     HPI Christine Mcbride presents for new pt ADD Discussed the use of AI scribe software for clinical note transcription with the patient, who gave verbal consent to proceed.  History of Present Illness Christine Mcbride is a 28 year old Mcbride who presents with difficulty focusing, suspected to be ADHD.  She has experienced difficulty focusing throughout her life, which has worsened since becoming a mother.In school, procrastinated and completed projects at last minute.  Grades were A&B's, but frequently daydreaming, distracted, losing things, which is still occurring.  Cannot stay on task.  Feels like a squirrel. At work, she struggles to complete tasks without getting distracted, often switching between tasks before finishing them, forgetting things, making mistakes, as noted in her work evaluations and commented on by co-workers. Similar issues occur at home, where she starts cleaning tasks but gets distracted and moves on to other tasks before completing them. Restless, losing things, not organized . She has tried a friend's Vyvanse  and found it helped her focus significantly, allowing her to complete tasks at work and home efficiently.  Brother had ADD/ADHD  She has a history of asthma and migraines. Her migraines are preceded by feeling 'really down' and sensitivity to light, but she does not experience auras or olfactory symptoms. She has irregular periods and has had wisdom teeth extraction as her only surgery. She uses birth control pills and has a history of occasional alcohol use, marijuana use, and vaping, which she has reduced due to headaches.  Family history includes Ehlers-Danlos syndrome and cholesterol issues, with her mother having had  skin cancer and cervical dysplasia, and her paternal grandmother having had a heart attack at 10.  She experiences anxiety, particularly when driving, and has had episodes of chest pain and heart palpitations, which she attributes to anxiety or panic. She frequently loses items, such as searching for her phone while holding it.     Current Outpatient Medications:    lisdexamfetamine  (VYVANSE ) 20 MG capsule, Take 1 capsule (20 mg total) by mouth daily., Disp: 30 capsule, Rfl: 0   norethindrone  (INCASSIA ) 0.35 MG tablet, Take 1 tablet (0.35 mg total) by mouth daily., Disp: 30 tablet, Rfl: 0  Past Medical History:  Diagnosis Date   Anemia    Asthma    H/O dysmenorrhea    Hx of migraines    Irregular menstrual cycle     Past Surgical History:  Procedure Laterality Date   WISDOM TOOTH EXTRACTION      Family History  Problem Relation Age of Onset   Other Mother        cervical dysplasia   Skin cancer Mother    Diabetes Father    Diabetes Maternal Grandmother    Hypertension Maternal Grandfather    Heart disease Paternal Grandmother 74   Diabetes Paternal Grandmother    Heart attack Paternal Grandmother     Social History   Socioeconomic History   Marital status: Single    Spouse name: Not on file   Number of children: 1   Years of education: Not on file   Highest education level: 12th grade  Occupational History   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, waitress  Tobacco Use  Smoking status: Never   Smokeless tobacco: Never  Vaping Use   Vaping status: Some Days  Substance and Sexual Activity   Alcohol use: Yes    Comment: occ   Drug use: No   Sexual activity: Yes    Partners: Male    Birth control/protection: Pill  Other Topics Concern   Not on file  Social History Narrative   Not on file   Social Drivers of Health   Financial Resource Strain: Low Risk  (01/16/2024)   Overall Financial Resource Strain (CARDIA)    Difficulty of Paying Living Expenses: Not hard at  all  Food Insecurity: No Food Insecurity (01/16/2024)   Hunger Vital Sign    Worried About Running Out of Food in the Last Year: Never true    Ran Out of Food in the Last Year: Never true  Transportation Needs: No Transportation Needs (01/16/2024)   PRAPARE - Administrator, Civil Service (Medical): No    Lack of Transportation (Non-Medical): No  Physical Activity: Sufficiently Active (01/16/2024)   Exercise Vital Sign    Days of Exercise per Week: 7 days    Minutes of Exercise per Session: 120 min  Stress: Stress Concern Present (01/16/2024)   Christine Mcbride    Feeling of Stress: Very much  Social Connections: Socially Isolated (01/16/2024)   Social Connection and Isolation Panel    Frequency of Communication with Friends and Family: More than three times a week    Frequency of Social Gatherings with Friends and Family: Twice a week    Attends Religious Services: Never    Database Administrator or Organizations: No    Attends Engineer, Structural: Not on file    Marital Status: Separated  Intimate Partner Violence: Not on file    ROS  ROS: Gen: no fever, chills  Skin: no rash, itching ENT: no ear pain, ear drainage, nasal congestion, rhinorrhea, sinus pressure, sore throat Eyes: no blurry vision, double vision Resp: no cough, wheeze,SOB CV: no CP, LE edema,  GI: no heartburn, n/v/d/c, abd pain GU: no dysuria, urgency, frequency, hematuria MSK: no joint pain, myalgias, back pain Neuro: no dizziness, headache, weakness, vertigo Psych: no depression,, insomnia, SI .  Some anxiety Objective:   Today's Vitals: BP 113/73   Pulse 76   Temp 98.4 F (36.9 C) (Temporal)   Resp 16   Ht 5' 1 (1.549 m)   Wt 151 lb (68.5 kg)   LMP 01/16/2024 (Exact Date)   SpO2 99%   BMI 28.53 kg/m   Physical Exam  Gen: WDWN NAD HEENT: NCAT, conjunctiva not injected, sclera nonicteric  NECK:  supple, no  thyromegaly, no nodes, no carotid bruits CARDIAC: RRR, S1S2+, no murmur. DP 2+B LUNGS: CTAB. No wheezes ABDOMEN:  BS+, soft, NTND, No HSM, no masses EXT:  no edema MSK: no gross abnormalities.  NEURO: A&O x3.  CN II-XII intact.  PSYCH: normal mood but gets off subject at times. Good eye contact   Assessment & Plan:  Attention deficit hyperactivity disorder (ADHD), combined type -     Urine drugs of abuse scrn w alc, routine (Ref Lab) -     Lisdexamfetamine  Dimesylate; Take 1 capsule (20 mg total) by mouth daily.  Dispense: 30 capsule; Refill: 0  Hx of migraine headaches  Assessment and Plan Assessment & Plan Attention-deficit disorder without hyperactivity   She experiences chronic difficulty focusing, completing tasks, and organizing activities, worsened by  increased responsibilities as a single mother. Symptoms include distractibility, task-switching, and losing items, present since childhood and more pronounced after becoming a mother. Although not formally diagnosed in childhood, her brother has ADHD. She has no history of medication for ADD. A recent trial of Vyvanse  from a friend improved her focus and task completion, suggesting potential benefit from medication. Concerns exist about medication side effects and addiction risk due to family history of substance abuse. Discussed risks and benefits of stimulant and non-stimulant medications, including side effects like decreased appetite and jitteriness, and stressed the importance of regular meals to prevent hypoglycemia. Emphasized coping strategies such as task lists and organization. Prescribe Vyvanse  20mg , starting with one pill daily for three days, then assess effectiveness. If ineffective, increase to two pills daily. Perform drug screen today. Discuss potential side effects and the importance of regular meals. Advise on coping strategies: make task lists, prioritize tasks, and organize belongings. Instruct to monitor medication supply  and request refills in advance. Schedule follow-up in one month to assess medication effectiveness and adjust dosage if necessary. Pdmp checked  Migraine-doing ok for now.  Keep log    Follow-up: Return in about 4 weeks (around 02/13/2024) for ADD.   Jenkins CHRISTELLA Carrel, MD "

## 2024-01-17 ENCOUNTER — Encounter: Payer: Self-pay | Admitting: Family Medicine

## 2024-01-20 LAB — PANEL 799049
CARBOXY THC GC/MS CONF: >750 ng/mL
Cannabinoid GC/MS, Ur: POSITIVE — AB

## 2024-01-20 LAB — URINE DRUGS OF ABUSE SCREEN W ALC, ROUTINE (REF LAB)
Amphetamines, Urine: NEGATIVE ng/mL
Barbiturate Quant, Ur: NEGATIVE ng/mL
Benzodiazepine Quant, Ur: NEGATIVE ng/mL
Cocaine (Metab.): NEGATIVE ng/mL
Creatinine, Urine: 117.7 mg/dL (ref 20.0–300.0)
Ethanol, Urine: NEGATIVE %
Methadone Screen, Urine: NEGATIVE ng/mL
Nitrite Urine, Quantitative: NEGATIVE ug/mL
OPIATE SCREEN URINE: NEGATIVE ng/mL
PCP Quant, Ur: NEGATIVE ng/mL
Propoxyphene: NEGATIVE ng/mL
pH, Urine: 6.4 (ref 4.5–8.9)

## 2024-01-23 ENCOUNTER — Ambulatory Visit: Payer: Self-pay | Admitting: Family Medicine

## 2024-01-28 ENCOUNTER — Other Ambulatory Visit: Payer: Self-pay | Admitting: Obstetrics and Gynecology

## 2024-01-28 DIAGNOSIS — Z3041 Encounter for surveillance of contraceptive pills: Secondary | ICD-10-CM

## 2024-01-29 NOTE — Progress Notes (Unsigned)
 PCP:  Wendolyn Jenkins Jansky, MD   No chief complaint on file.    HPI:      Christine Mcbride is a 28 y.o. G1P1001 whose LMP was Patient's last menstrual period was 01/16/2024 (exact date)., presents today for her annual examination.  Her menses are regular every 28-30 days, lasting 3-4 days on POPs with 1 heavy day.  Dysmenorrhea mild, no BTB.  Sex activity: single partner, contraception - POPs due to migraines; no pain/bleeding/dryness Last Pap: 12/22/22 Results were: no abnormalities   There is no FH of breast cancer. There is no FH of ovarian cancer. The patient does do self-breast exams.  Tobacco use: vapes occas Alcohol use: none No drug use.  Exercise: moderately active  She does get adequate calcium but not Vitamin D in her diet. Unsure if [place] done  Patient Active Problem List   Diagnosis Date Noted   Attention deficit hyperactivity disorder (ADHD), combined type 01/16/2024   Post-dates pregnancy 06/29/2021   Obesity (BMI 30.0-34.9) 05/26/2021   Rh negative state in antepartum period 01/12/2021   Gastroesophageal reflux in pregnancy 01/12/2021   Hx of migraine headaches 01/12/2021    Past Surgical History:  Procedure Laterality Date   WISDOM TOOTH EXTRACTION      Family History  Problem Relation Age of Onset   Other Mother        cervical dysplasia   Skin cancer Mother    Diabetes Father    Diabetes Maternal Grandmother    Hypertension Maternal Grandfather    Heart disease Paternal Grandmother 39   Diabetes Paternal Grandmother    Heart attack Paternal Grandmother     Social History   Socioeconomic History   Marital status: Single    Spouse name: Not on file   Number of children: 1   Years of education: Not on file   Highest education level: 12th grade  Occupational History   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, waitress  Tobacco Use   Smoking status: Never   Smokeless tobacco: Never  Vaping Use   Vaping status: Some Days  Substance and Sexual  Activity   Alcohol use: Yes    Comment: occ   Drug use: No   Sexual activity: Yes    Partners: Male    Birth control/protection: Pill  Other Topics Concern   Not on file  Social History Narrative   Not on file   Social Drivers of Health   Financial Resource Strain: Low Risk  (01/16/2024)   Overall Financial Resource Strain (CARDIA)    Difficulty of Paying Living Expenses: Not hard at all  Food Insecurity: No Food Insecurity (01/16/2024)   Hunger Vital Sign    Worried About Running Out of Food in the Last Year: Never true    Ran Out of Food in the Last Year: Never true  Transportation Needs: No Transportation Needs (01/16/2024)   PRAPARE - Administrator, Civil Service (Medical): No    Lack of Transportation (Non-Medical): No  Physical Activity: Sufficiently Active (01/16/2024)   Exercise Vital Sign    Days of Exercise per Week: 7 days    Minutes of Exercise per Session: 120 min  Stress: Stress Concern Present (01/16/2024)   [employer] of Occupational Health - Occupational Stress Questionnaire    Feeling of Stress: Very much  Social Connections: Socially Isolated (01/16/2024)   Social Connection and Isolation Panel    Frequency of Communication with Friends and Family: More than three times a week  Frequency of Social Gatherings with Friends and Family: Twice a week    Attends Religious Services: Never    Database administrator or Organizations: No    Attends Engineer, structural: Not on file    Marital Status: Separated  Intimate Partner Violence: Not on file     Current Outpatient Medications:    lisdexamfetamine (VYVANSE ) 20 MG capsule, Take 1 capsule (20 mg total) by mouth daily., Disp: 30 capsule, Rfl: 0   norethindrone  (INCASSIA ) 0.35 MG tablet, Take 1 tablet (0.35 mg total) by mouth daily., Disp: 30 tablet, Rfl: 0     ROS:  Review of Systems  Constitutional:  Negative for fatigue, fever and unexpected weight change.  Respiratory:   Negative for cough, shortness of breath and wheezing.   Cardiovascular:  Negative for chest pain, palpitations and leg swelling.  Gastrointestinal:  Negative for blood in stool, constipation, diarrhea, nausea and vomiting.  Endocrine: Negative for cold intolerance, heat intolerance and polyuria.  Genitourinary:  Negative for dyspareunia, dysuria, flank pain, frequency, genital sores, hematuria, menstrual problem, pelvic pain, urgency, vaginal bleeding, vaginal discharge and vaginal pain.  Musculoskeletal:  Negative for back pain, joint swelling and myalgias.  Skin:  Negative for rash.  Neurological:  Negative for dizziness, syncope, light-headedness, numbness and headaches.  Hematological:  Negative for adenopathy.  Psychiatric/Behavioral:  Negative for agitation, confusion, sleep disturbance and suicidal ideas. The patient is not nervous/anxious.    BREAST: No symptoms   Objective: LMP 01/16/2024 (Exact Date)    Physical Exam Constitutional:      Appearance: She is well-developed.  Genitourinary:     Vulva normal.     Right Labia: No rash, tenderness or lesions.    Left Labia: No tenderness, lesions or rash.    No vaginal discharge, erythema or tenderness.      Right Adnexa: not tender and no mass present.    Left Adnexa: not tender and no mass present.    No cervical friability or polyp.     Uterus is not enlarged or tender.  Breasts:    Right: No mass, nipple discharge, skin change or tenderness.     Left: No mass, nipple discharge, skin change or tenderness.  Neck:     Thyroid: No thyromegaly.  Cardiovascular:     Rate and Rhythm: Normal rate and regular rhythm.     Heart sounds: Normal heart sounds. No murmur heard. Pulmonary:     Effort: Pulmonary effort is normal.     Breath sounds: Normal breath sounds.  Abdominal:     Palpations: Abdomen is soft.     Tenderness: There is no abdominal tenderness. There is no guarding or rebound.  Musculoskeletal:        General:  Normal range of motion.     Cervical back: Normal range of motion.  Lymphadenopathy:     Cervical: No cervical adenopathy.  Neurological:     General: No focal deficit present.     Mental Status: She is alert and oriented to person, place, and time.     Cranial Nerves: No cranial nerve deficit.  Skin:    General: Skin is warm and dry.  Psychiatric:        Mood and Affect: Mood normal.        Behavior: Behavior normal.        Thought Content: Thought content normal.        Judgment: Judgment normal.  Vitals reviewed.    Assessment/Plan: Encounter for annual routine  gynecological examination  Cervical cancer screening - Plan: Cytology - PAP  Encounter for surveillance of contraceptive pills - Plan: norethindrone  (INCASSIA ) 0.35 MG tablet; Rx RF  No orders of the defined types were placed in this encounter.            GYN counsel adequate intake of calcium and vitamin D, diet and exercise; d/c vaping; Gardasil check with her mom     F/U  No follow-ups on file.  Rosaleigh Brazzel B. Raydan Schlabach, PA-C 01/29/2024 4:49 PM

## 2024-01-30 ENCOUNTER — Other Ambulatory Visit (HOSPITAL_COMMUNITY)
Admission: RE | Admit: 2024-01-30 | Discharge: 2024-01-30 | Disposition: A | Discharge status: Home / Self Care | Source: Ambulatory Visit | Attending: Obstetrics and Gynecology | Admitting: Obstetrics and Gynecology

## 2024-01-30 ENCOUNTER — Ambulatory Visit (INDEPENDENT_AMBULATORY_CARE_PROVIDER_SITE_OTHER): Admitting: Obstetrics and Gynecology

## 2024-01-30 ENCOUNTER — Encounter: Payer: Self-pay | Admitting: Obstetrics and Gynecology

## 2024-01-30 VITALS — BP 104/67 | HR 111 | Ht 61.0 in | Wt 147.0 lb

## 2024-01-30 DIAGNOSIS — Z01419 Encounter for gynecological examination (general) (routine) without abnormal findings: Secondary | ICD-10-CM | POA: Diagnosis not present

## 2024-01-30 DIAGNOSIS — Z113 Encounter for screening for infections with a predominantly sexual mode of transmission: Secondary | ICD-10-CM | POA: Diagnosis present

## 2024-01-30 DIAGNOSIS — Z3041 Encounter for surveillance of contraceptive pills: Secondary | ICD-10-CM

## 2024-01-30 MED ORDER — NORETHINDRONE 0.35 MG PO TABS: 1.0000 | ORAL_TABLET | Freq: Every day | ORAL | 3 refills | Status: AC

## 2024-01-30 NOTE — Patient Instructions (Signed)
 I value your feedback and you entrusting [place] with your care. If you get a [place] patient survey, I would appreciate you taking the time to let us know about your experience today. Thank you! ? ? ?

## 2024-02-01 LAB — CERVICOVAGINAL ANCILLARY ONLY
Chlamydia: NEGATIVE
Comment: NEGATIVE
Comment: NORMAL
Neisseria Gonorrhea: NEGATIVE

## 2024-02-16 ENCOUNTER — Encounter: Payer: Self-pay | Admitting: Family Medicine

## 2024-02-16 ENCOUNTER — Ambulatory Visit: Admitting: Family Medicine

## 2024-02-16 DIAGNOSIS — F902 Attention-deficit hyperactivity disorder, combined type: Secondary | ICD-10-CM

## 2024-02-16 MED ORDER — LISDEXAMFETAMINE DIMESYLATE 50 MG PO CAPS: 50.0000 mg | ORAL_CAPSULE | Freq: Every day | ORAL | 0 refills | Status: DC

## 2024-02-16 NOTE — Patient Instructions (Signed)

## 2024-02-16 NOTE — Progress Notes (Signed)
 Subjective:     Patient ID: Christine Mcbride, female    DOB: 13-Apr-1996, 28 y.o.   MRN: [id number]  Chief Complaint  Patient presents with   Medication Refill    Vyvanse ; no other concerns    HPI Discussed the use of AI scribe software for clinical note transcription with the patient, who gave verbal consent to proceed.  History of Present Illness Christine Mcbride is a 28 year old female who presents for medication management of ADD.  She is currently taking Vyvanse  20 mg but has been taking two pills daily as she felt one was insufficient. Initially, one pill was effective, but she experienced 'breakthrough squirrel' moments, indicating a lack of focus. After increasing to two pills, she noticed significant improvement in her ability to focus, particularly at work. However, she still experiences occasional moments of distraction.  No side effects such as heart palpitations, shakiness, jitteriness, nausea, decreased appetite, or suicidal thoughts. The only side effect noted is increased sweating, which is more pronounced when the medication kicks in. Her appetite remains unchanged, and she maintains her usual eating habits, including having a big breakfast and snacking throughout the day. She ensures to eat dinner regularly.  Regarding her sleep, the medication wears off by the evening, allowing her to maintain a regular sleep schedule. She works varying shifts from 10 AM to 7 PM and 6:30 AM to 3:30 PM, and takes her medication around 7:30 to 8:00 AM, which suits both shifts. She is typically in bed by 10 PM, except on days she does not take the medication, such as when she stayed up due to her son's illness.  She has never been on medication other than birth control and is unfamiliar with the process of obtaining refills for controlled substances.    Health Maintenance Due  Topic Date Due   HPV VACCINES (1 - 3-dose SCDM series) Never done    Past Medical History:  Diagnosis Date    Anemia    Asthma    H/O dysmenorrhea    Hx of migraines    Irregular menstrual cycle    Kidney stone     Past Surgical History:  Procedure Laterality Date   WISDOM TOOTH EXTRACTION       Current Outpatient Medications:    lisdexamfetamine (VYVANSE ) 50 MG capsule, Take 1 capsule (50 mg total) by mouth daily., Disp: 30 capsule, Rfl: 0   norethindrone  (INCASSIA ) 0.35 MG tablet, Take 1 tablet (0.35 mg total) by mouth daily., Disp: 84 tablet, Rfl: 3  No Known Allergies ROS neg/noncontributory except as noted HPI/below      Objective:     BP 98/60   Pulse 87   Temp 97.9 F (36.6 C)   Ht 5' 1 (1.549 m)   Wt 145 lb (65.8 kg)   LMP 01/30/2024 (Exact Date)   SpO2 97%   BMI 27.40 kg/m  Wt Readings from Last 3 Encounters:  02/16/24 145 lb (65.8 kg)  01/30/24 147 lb (66.7 kg)  01/16/24 151 lb (68.5 kg)    Physical Exam   Gen: WDWN NAD HEENT: NCAT, conjunctiva not injected, sclera nonicteric NECK:  supple, no thyromegaly, no nodes, no carotid bruits CARDIAC: RRR, S1S2+, no murmur.  LUNGS: CTAB. No wheezes ABDOMEN:  BS+, soft, NTND, No HSM, no masses EXT:  no edema MSK: no gross abnormalities.  NEURO: A&O x3.  CN II-XII intact.  PSYCH: normal mood. Good eye contact  Pdmp checked     Assessment &  Plan:  Attention deficit hyperactivity disorder (ADHD), combined type -     Lisdexamfetamine Dimesylate ; Take 1 capsule (50 mg total) by mouth daily.  Dispense: 30 capsule; Refill: 0  Assessment and Plan Assessment & Plan Attention-deficit hyperactivity disorder, combined type   Currently on Vyvanse  40 mg, which has improved focus and work performance. Occasional breakthrough symptoms suggest the dose may not be fully effective. She reports increased sweating as a side effect but denies heart palpitations, jitteriness, nausea, decreased appetite, or suicidal thoughts. Sleep schedule remains unaffected. Increase Vyvanse  to 50 mg daily to address breakthrough symptoms while  monitoring for potential side effects. Discussed starting at a low dose and gradually increasing to minimize side effects. She prefers a single dose rather than multiple pills. Instruct to contact the office via MyChart for medication refills or dose adjustments. Schedule follow-up appointment in three months.    Return in about 3 months (around 05/17/2024) for add.  Jenkins CHRISTELLA Carrel, MD

## 2024-02-19 ENCOUNTER — Ambulatory Visit: Admitting: Family Medicine

## 2024-03-12 ENCOUNTER — Telehealth: Payer: Self-pay | Admitting: Family Medicine

## 2024-03-12 ENCOUNTER — Telehealth: Payer: Self-pay

## 2024-03-12 ENCOUNTER — Other Ambulatory Visit: Payer: Self-pay | Admitting: Family Medicine

## 2024-03-12 DIAGNOSIS — F902 Attention-deficit hyperactivity disorder, combined type: Secondary | ICD-10-CM

## 2024-03-12 MED ORDER — LISDEXAMFETAMINE DIMESYLATE 50 MG PO CAPS: 50.0000 mg | ORAL_CAPSULE | Freq: Every day | ORAL | 0 refills | Status: DC

## 2024-03-12 NOTE — Telephone Encounter (Unsigned)
 Copied from CRM [id number]. Topic: Clinical - Medication Refill >> Mar 12, 2024  3:33 PM Suzen RAMAN wrote: Medication: lisdexamfetamine (VYVANSE ) 50 MG capsule   Has the patient contacted their pharmacy? Yes  This is the patient's preferred pharmacy:  [facility] [phone] [facility], [place] - [address] [place] [address] [place] [address] Phone: [phone] Fax: [phone]  Is this the correct pharmacy for this prescription? Yes If no, delete pharmacy and type the correct one.   Has the prescription been filled recently? No  Is the patient out of the medication? No  Has the patient been seen for an appointment in the last year OR does the patient have an upcoming appointment? Yes  Can we respond through MyChart? Yes  Agent: Please be advised that Rx refills may take up to 3 business days. We ask that you follow-up with your pharmacy.

## 2024-03-12 NOTE — Telephone Encounter (Signed)
 Copied from CRM [id number]. Topic: General - Other >> Mar 12, 2024  3:37 PM Suzen RAMAN wrote: Reason for CRM: patient called to inform provider that medication lisdexamfetamine (VYVANSE ) 50 MG capsule is very effective. Patient would like a response in MyChart so she can continuously respond back to thread for future communication. Patient states MyChart would not allow her to send a message or search for provider.

## 2024-04-24 ENCOUNTER — Telehealth: Payer: Self-pay | Admitting: Family Medicine

## 2024-04-24 DIAGNOSIS — F902 Attention-deficit hyperactivity disorder, combined type: Secondary | ICD-10-CM

## 2024-04-24 NOTE — Telephone Encounter (Unsigned)
 Copied from CRM #[id number]. Topic: Clinical - Medication Refill >> Apr 24, 2024  3:19 PM Wess S wrote: Medication: lisdexamfetamine (VYVANSE ) 50 MG capsule   Has the patient contacted their pharmacy? No (Agent: If no, request that the patient contact the pharmacy for the refill. If patient does not wish to contact the pharmacy document the reason why and proceed with request.) (Agent: If yes, when and what did the pharmacy advise?)  This is the patient's preferred pharmacy:  [facility] [phone], [place] - [address] [place] [address] [place] [address] [place] [address] Phone: [phone] Fax: [phone]  Is this the correct pharmacy for this prescription? Yes If no, delete pharmacy and type the correct one.   Has the prescription been filled recently? Yes  Is the patient out of the medication? Yes  Has the patient been seen for an appointment in the last year OR does the patient have an upcoming appointment? Yes  Can we respond through MyChart? Yes  Agent: Please be advised that Rx refills may take up to 3 business days. We ask that you follow-up with your pharmacy.

## 2024-04-25 MED ORDER — LISDEXAMFETAMINE DIMESYLATE 50 MG PO CAPS: 50.0000 mg | ORAL_CAPSULE | Freq: Every day | ORAL | 0 refills | Status: AC

## 2024-04-25 NOTE — Telephone Encounter (Signed)
 Pt called to check the status of refill request. I informed her it is pending and awaiting aher provider signature. She stated that she took her last pill and is concerned because she has two jobs and won't be able to focus if she doesn't have her meds. She requests that someone please msg her and let her know that the order has been signed so she can check with the pharmacy for pick up time

## 2024-05-29 ENCOUNTER — Ambulatory Visit: Admitting: Family Medicine
